# Patient Record
Sex: Male | Born: 1955 | Race: White | Hispanic: No | Marital: Married | Smoking: Former smoker
Health system: Southern US, Community
[De-identification: ages and names within clinical notes are randomized; demographics above are authoritative.]

## PROBLEM LIST (undated history)

## (undated) DIAGNOSIS — I10 Essential (primary) hypertension: Secondary | ICD-10-CM

## (undated) DIAGNOSIS — E119 Type 2 diabetes mellitus without complications: Secondary | ICD-10-CM

## (undated) DIAGNOSIS — M1990 Unspecified osteoarthritis, unspecified site: Secondary | ICD-10-CM

## (undated) DIAGNOSIS — E785 Hyperlipidemia, unspecified: Secondary | ICD-10-CM

## (undated) DIAGNOSIS — N390 Urinary tract infection, site not specified: Secondary | ICD-10-CM

## (undated) DIAGNOSIS — N200 Calculus of kidney: Secondary | ICD-10-CM

## (undated) DIAGNOSIS — E7800 Pure hypercholesterolemia, unspecified: Secondary | ICD-10-CM

## (undated) DIAGNOSIS — N400 Enlarged prostate without lower urinary tract symptoms: Secondary | ICD-10-CM

## (undated) HISTORY — DX: Pure hypercholesterolemia, unspecified: E78.00

## (undated) HISTORY — DX: Urinary tract infection, site not specified: N39.0

## (undated) HISTORY — DX: Type 2 diabetes mellitus without complications: E11.9

## (undated) HISTORY — DX: Essential (primary) hypertension: I10

## (undated) HISTORY — DX: Calculus of kidney: N20.0

## (undated) HISTORY — PX: HERNIA REPAIR: SHX51

## (undated) HISTORY — DX: Benign prostatic hyperplasia without lower urinary tract symptoms: N40.0

## (undated) HISTORY — DX: Unspecified osteoarthritis, unspecified site: M19.90

## (undated) HISTORY — PX: KIDNEY STONE SURGERY: SHX686

## (undated) HISTORY — DX: Hyperlipidemia, unspecified: E78.5

---

## 1998-03-14 DIAGNOSIS — E119 Type 2 diabetes mellitus without complications: Secondary | ICD-10-CM

## 1998-03-14 HISTORY — DX: Type 2 diabetes mellitus without complications: E11.9

## 2002-02-10 ENCOUNTER — Emergency Department: Admit: 2002-02-10 | Payer: Self-pay | Source: Emergency Department | Admitting: Emergency Medicine

## 2005-11-23 ENCOUNTER — Emergency Department: Admit: 2005-11-23 | Payer: Self-pay | Source: Emergency Department | Admitting: Emergency Medicine

## 2005-11-23 LAB — CBC WITH AUTO DIFFERENTIAL CERNER
Basophils Absolute: 0 /mm3 (ref 0.0–0.2)
Basophils: 1 % (ref 0–2)
Eosinophils Absolute: 0.3 /mm3 (ref 0.0–0.7)
Eosinophils: 4 % (ref 0–5)
Granulocytes Absolute: 3.5 /mm3 (ref 1.8–8.1)
Hematocrit: 35.1 % — ABNORMAL LOW (ref 42.0–52.0)
Hgb: 12.3 G/DL — ABNORMAL LOW (ref 13.0–17.0)
Lymphocytes Absolute: 2.4 /mm3 (ref 0.5–4.4)
Lymphocytes: 36 % (ref 15–41)
MCH: 30.9 PG (ref 28.0–32.0)
MCHC: 35.1 G/DL (ref 32.0–36.0)
MCV: 88.1 FL (ref 80.0–100.0)
MPV: 6.8 FL — ABNORMAL LOW (ref 7.4–10.4)
Monocytes Absolute: 0.6 /mm3 (ref 0.0–1.2)
Monocytes: 9 % (ref 0–11)
Neutrophils %: 51 % — ABNORMAL LOW (ref 52–75)
Platelets: 298 /mm3 (ref 140–400)
RBC: 3.98 /mm3 — ABNORMAL LOW (ref 4.70–6.00)
RDW: 12.8 % (ref 11.5–15.0)
WBC: 6.9 /mm3 (ref 3.5–10.8)

## 2005-11-23 LAB — BASIC METABOLIC PANEL
BUN: 24 MG/DL — ABNORMAL HIGH (ref 7–21)
CO2: 24 MEQ/L (ref 22–31)
Calcium: 10.1 MG/DL (ref 8.6–10.2)
Chloride: 105 MEQ/L (ref 98–107)
Creatinine: 1 MG/DL (ref 0.5–1.4)
Glucose: 116 MG/DL — ABNORMAL HIGH (ref 65–110)
Potassium: 4.4 MEQ/L (ref 3.6–5.0)
Sodium: 140 MEQ/L (ref 136–143)

## 2005-11-23 LAB — CKMB MASS CERNER: CKMB Mass: 2.7 ng/mL (ref 0.0–5.0)

## 2005-11-23 LAB — CREATINE KINASE W/O REFLEX (SOFT): Creatine Kinase (CK): 374 U/L — ABNORMAL HIGH (ref 20–297)

## 2005-11-23 LAB — GFR

## 2005-11-23 LAB — TROPONIN I QUANTITATIVE LEVEL - IFOH CERNER: Troponin I Quant: <0.08 NG/ML

## 2006-02-21 ENCOUNTER — Emergency Department: Admit: 2006-02-21 | Payer: Self-pay | Source: Emergency Department | Admitting: Emergency Medical Services

## 2006-02-21 LAB — CBC WITH AUTO DIFFERENTIAL CERNER
Basophils Absolute: 0.1 /mm3 (ref 0.0–0.2)
Basophils: 2 % (ref 0–2)
Eosinophils Absolute: 0.1 /mm3 (ref 0.0–0.7)
Eosinophils: 2 % (ref 0–5)
Granulocytes Absolute: 3.9 /mm3 (ref 1.8–8.1)
Hematocrit: 35.3 % — ABNORMAL LOW (ref 42.0–52.0)
Hgb: 12.4 G/DL — ABNORMAL LOW (ref 13.0–17.0)
Lymphocytes Absolute: 1.6 /mm3 (ref 0.5–4.4)
Lymphocytes: 26 % (ref 15–41)
MCH: 30.9 PG (ref 28.0–32.0)
MCHC: 35 G/DL (ref 32.0–36.0)
MCV: 88.1 FL (ref 80.0–100.0)
MPV: 7 FL — ABNORMAL LOW (ref 7.4–10.4)
Monocytes Absolute: 0.4 /mm3 (ref 0.0–1.2)
Monocytes: 7 % (ref 0–11)
Neutrophils %: 63 % (ref 52–75)
Platelets: 315 /mm3 (ref 140–400)
RBC: 4.01 /mm3 — ABNORMAL LOW (ref 4.70–6.00)
RDW: 12.9 % (ref 11.5–15.0)
WBC: 6.1 /mm3 (ref 3.5–10.8)

## 2006-02-21 LAB — BASIC METABOLIC PANEL
BUN: 26 MG/DL — ABNORMAL HIGH (ref 7–21)
CO2: 23 MEQ/L (ref 22–31)
Calcium: 9.7 MG/DL (ref 8.6–10.2)
Chloride: 106 MEQ/L (ref 98–107)
Creatinine: 1.1 MG/DL (ref 0.5–1.4)
Glucose: 347 MG/DL — ABNORMAL HIGH (ref 65–110)
Potassium: 5.4 MEQ/L — ABNORMAL HIGH (ref 3.6–5.0)
Sodium: 139 MEQ/L (ref 136–143)

## 2006-02-21 LAB — GFR

## 2006-11-09 ENCOUNTER — Emergency Department: Admit: 2006-11-09 | Payer: Self-pay | Source: Emergency Department | Admitting: Emergency Medical Services

## 2006-11-09 LAB — BASIC METABOLIC PANEL
BUN: 22 MG/DL — ABNORMAL HIGH (ref 7–21)
CO2: 23 MEQ/L (ref 22–31)
Calcium: 9.4 MG/DL (ref 8.6–10.2)
Chloride: 103 MEQ/L (ref 98–107)
Creatinine: 1.2 MG/DL (ref 0.5–1.4)
Glucose: 235 MG/DL — ABNORMAL HIGH (ref 65–110)
Potassium: 5.4 MEQ/L — ABNORMAL HIGH (ref 3.6–5.0)
Sodium: 138 MEQ/L (ref 136–143)

## 2006-11-09 LAB — HEPATIC FUNCTION PANEL
ALT: 35 U/L (ref 7–56)
AST (SGOT): 33 U/L (ref 5–40)
Albumin/Globulin Ratio: 1.7 (ref 1.1–1.8)
Albumin: 4.8 G/DL (ref 3.7–5.1)
Alkaline Phosphatase: 71 U/L (ref 43–122)
Bilirubin Direct: 0 MG/DL — AB (ref 0.0–0.3)
Bilirubin Indirect: 0.5 MG/DL (ref 0.0–1.1)
Bilirubin, Total: 0.5 MG/DL (ref 0.2–1.3)
Globulin: 2.9 G/DL (ref 2.0–3.7)
Protein, Total: 7.7 G/DL (ref 6.0–8.0)

## 2006-11-09 LAB — CBC WITH AUTO DIFFERENTIAL CERNER
Hematocrit: 39.5 % — ABNORMAL LOW (ref 42.0–52.0)
Hgb: 14.1 G/DL (ref 13.0–17.0)
MCH: 30.8 PG (ref 28.0–32.0)
MCHC: 35.7 G/DL (ref 32.0–36.0)
MCV: 86.3 FL (ref 80.0–100.0)
MPV: 7.1 FL — ABNORMAL LOW (ref 7.4–10.4)
Platelets: 283 /mm3 (ref 140–400)
RBC: 4.58 /mm3 — ABNORMAL LOW (ref 4.70–6.00)
RDW: 12.9 % (ref 11.5–15.0)
WBC: 13.4 /mm3 — ABNORMAL HIGH (ref 3.5–10.8)

## 2006-11-09 LAB — LIPASE: Lipase: 174 U/L (ref 23–300)

## 2006-11-09 LAB — GFR

## 2006-12-20 ENCOUNTER — Emergency Department: Admit: 2006-12-20 | Payer: Self-pay | Source: Emergency Department

## 2008-12-04 ENCOUNTER — Emergency Department: Admit: 2008-12-04 | Payer: Self-pay | Source: Emergency Department

## 2008-12-04 LAB — BASIC METABOLIC PANEL
BUN: 21 MG/DL (ref 7–21)
CO2: 23 MEQ/L (ref 22–31)
Calcium: 10.1 MG/DL (ref 8.6–10.2)
Chloride: 99 MEQ/L (ref 98–107)
Creatinine: 0.8 MG/DL (ref 0.5–1.4)
Glucose: 122 MG/DL — ABNORMAL HIGH (ref 65–110)
Potassium: 4.3 MEQ/L (ref 3.6–5.0)
Sodium: 135 MEQ/L — ABNORMAL LOW (ref 136–143)

## 2008-12-04 LAB — URINALYSIS
Bilirubin, UA: NEGATIVE
Blood, UA: NEGATIVE
Glucose, UA: NEGATIVE
Ketones UA: NEGATIVE
Leukocyte Esterase, UA: NEGATIVE
Nitrite, UA: NEGATIVE
Protein, UR: NEGATIVE
Specific Gravity UA POCT: 1.004 (ref 1.001–1.035)
Urine pH: 5.5 (ref 5.0–8.0)
Urobilinogen, UA: NORMAL mg/dL

## 2008-12-04 LAB — CBC AND DIFFERENTIAL
Basophils Absolute: 0 /mm3 (ref 0.0–0.2)
Basophils: 0 % (ref 0–2)
Eosinophils Absolute: 0.2 /mm3 (ref 0.0–0.7)
Eosinophils: 2 % (ref 0–5)
Granulocytes Absolute: 5.7 /mm3 (ref 1.8–8.1)
Hematocrit: 35.5 % — ABNORMAL LOW (ref 42.0–52.0)
Hgb: 12.9 G/DL — ABNORMAL LOW (ref 13.0–17.0)
Immature Granulocytes Absolute: 0 CUMM (ref 0.0–0.0)
Immature Granulocytes: 0 % (ref 0–1)
Lymphocytes Absolute: 2.7 /mm3 (ref 0.5–4.4)
Lymphocytes: 29 % (ref 15–41)
MCH: 30.3 PG (ref 28.0–32.0)
MCHC: 36.3 G/DL — ABNORMAL HIGH (ref 32.0–36.0)
MCV: 83.3 FL (ref 80.0–100.0)
MPV: 8.6 FL — ABNORMAL LOW (ref 9.4–12.3)
Monocytes Absolute: 0.8 /mm3 (ref 0.0–1.2)
Monocytes: 9 % (ref 0–11)
Neutrophils %: 60 % (ref 52–75)
Platelets: 237 /mm3 (ref 140–400)
RBC: 4.26 /mm3 — ABNORMAL LOW (ref 4.70–6.00)
RDW: 12.5 % (ref 11.5–15.0)
WBC: 9.44 /mm3 (ref 3.50–10.80)

## 2008-12-04 LAB — CKMB MASS CERNER: CKMB Mass: 2.7 ng/mL (ref 0.0–5.0)

## 2008-12-04 LAB — TROPONIN I QUANTITATIVE LEVEL - IFOH CERNER: Troponin I Quant: <0.01 NG/ML

## 2008-12-04 LAB — GFR

## 2008-12-04 LAB — CK: Creatine Kinase (CK): 260 U/L (ref 20–297)

## 2010-08-27 ENCOUNTER — Emergency Department
Admit: 2010-08-27 | Disposition: A | Discharge status: Home / Self Care | Payer: Self-pay | Source: Emergency Department | Admitting: Emergency Medicine

## 2010-08-31 ENCOUNTER — Ambulatory Visit: Admit: 2010-08-31 | Discharge: 2010-08-31 | Payer: Self-pay | Source: Ambulatory Visit

## 2010-09-10 ENCOUNTER — Ambulatory Visit: Admit: 2010-09-10 | Discharge: 2010-09-10 | Payer: Self-pay | Source: Ambulatory Visit

## 2010-12-08 LAB — ECG 12-LEAD
Atrial Rate: 102 {beats}/min
P Axis: 48 degrees
P-R Interval: 176 ms
Q-T Interval: 340 ms
QRS Duration: 106 ms
QTC Calculation (Bezet): 443 ms
R Axis: -28 degrees
T Axis: 49 degrees
Ventricular Rate: 102 {beats}/min

## 2010-12-28 LAB — ECG 12-LEAD
Atrial Rate: 82 {beats}/min
P Axis: 42 degrees
P-R Interval: 180 ms
Q-T Interval: 366 ms
QRS Duration: 104 ms
QTC Calculation (Bezet): 427 ms
R Axis: -17 degrees
T Axis: 30 degrees
Ventricular Rate: 82 {beats}/min

## 2010-12-30 LAB — ECG 12-LEAD
Atrial Rate: 102 {beats}/min
P Axis: 33 degrees
P-R Interval: 164 ms
Q-T Interval: 326 ms
QRS Duration: 94 ms
QTC Calculation (Bezet): 424 ms
R Axis: -12 degrees
T Axis: 1 degrees
Ventricular Rate: 102 {beats}/min

## 2014-04-30 ENCOUNTER — Observation Stay: Payer: BLUE CROSS/BLUE SHIELD | Admitting: Internal Medicine

## 2014-04-30 ENCOUNTER — Observation Stay
Admission: EM | Admit: 2014-04-30 | Discharge: 2014-05-01 | Disposition: A | Discharge status: Home / Self Care | Payer: BLUE CROSS/BLUE SHIELD | Attending: Internal Medicine | Admitting: Internal Medicine

## 2014-04-30 ENCOUNTER — Emergency Department: Payer: BLUE CROSS/BLUE SHIELD

## 2014-04-30 DIAGNOSIS — R0789 Other chest pain: Principal | ICD-10-CM | POA: Insufficient documentation

## 2014-04-30 DIAGNOSIS — E119 Type 2 diabetes mellitus without complications: Secondary | ICD-10-CM | POA: Insufficient documentation

## 2014-04-30 DIAGNOSIS — R079 Chest pain, unspecified: Secondary | ICD-10-CM | POA: Diagnosis present

## 2014-04-30 DIAGNOSIS — F17290 Nicotine dependence, other tobacco product, uncomplicated: Secondary | ICD-10-CM | POA: Insufficient documentation

## 2014-04-30 DIAGNOSIS — I10 Essential (primary) hypertension: Secondary | ICD-10-CM | POA: Insufficient documentation

## 2014-04-30 DIAGNOSIS — E785 Hyperlipidemia, unspecified: Secondary | ICD-10-CM | POA: Insufficient documentation

## 2014-04-30 LAB — CBC AND DIFFERENTIAL
Basophils Absolute Automated: 0.02 10*3/uL (ref 0.00–0.20)
Basophils Automated: 0 %
Eosinophils Absolute Automated: 0.15 10*3/uL (ref 0.00–0.70)
Eosinophils Automated: 2 %
Hematocrit: 37.7 % — ABNORMAL LOW (ref 42.0–52.0)
Hgb: 13.6 g/dL (ref 13.0–17.0)
Immature Granulocytes Absolute: 0.01 10*3/uL
Immature Granulocytes: 0 %
Lymphocytes Absolute Automated: 1.8 10*3/uL (ref 0.50–4.40)
Lymphocytes Automated: 28 %
MCH: 31.1 pg (ref 28.0–32.0)
MCHC: 36.1 g/dL — ABNORMAL HIGH (ref 32.0–36.0)
MCV: 86.1 fL (ref 80.0–100.0)
MPV: 8.8 fL — ABNORMAL LOW (ref 9.4–12.3)
Monocytes Absolute Automated: 0.49 10*3/uL (ref 0.00–1.20)
Monocytes: 8 %
Neutrophils Absolute: 3.94 10*3/uL (ref 1.80–8.10)
Neutrophils: 62 %
Nucleated RBC: 0 /100 WBC (ref 0–1)
Platelets: 207 10*3/uL (ref 140–400)
RBC: 4.38 10*6/uL — ABNORMAL LOW (ref 4.70–6.00)
RDW: 13 % (ref 12–15)
WBC: 6.4 10*3/uL (ref 3.50–10.80)

## 2014-04-30 LAB — TROPONIN I
Troponin I: <0.01 ng/mL (ref 0.00–0.09)
Troponin I: <0.01 ng/mL (ref 0.00–0.09)

## 2014-04-30 LAB — BASIC METABOLIC PANEL
Anion Gap: 9 (ref 5.0–15.0)
BUN: 15 mg/dL (ref 9–28)
CO2: 23 mEq/L (ref 22–29)
Calcium: 9.4 mg/dL (ref 8.5–10.5)
Chloride: 106 mEq/L (ref 100–111)
Creatinine: 0.8 mg/dL (ref 0.7–1.3)
Glucose: 165 mg/dL — ABNORMAL HIGH (ref 70–100)
Potassium: 4.4 mEq/L (ref 3.5–5.1)
Sodium: 138 mEq/L (ref 136–145)

## 2014-04-30 LAB — GLUCOSE WHOLE BLOOD - POCT
Whole Blood Glucose POCT: 142 mg/dL — ABNORMAL HIGH (ref 70–100)
Whole Blood Glucose POCT: 146 mg/dL — ABNORMAL HIGH (ref 70–100)
Whole Blood Glucose POCT: 169 mg/dL — ABNORMAL HIGH (ref 70–100)

## 2014-04-30 LAB — GFR: EGFR: >60

## 2014-04-30 MED ORDER — ATORVASTATIN CALCIUM 20 MG PO TABS
80.0000 mg | ORAL_TABLET | Freq: Every day | ORAL | Status: DC
Start: 2014-04-30 — End: 2014-05-01
  Administered 2014-04-30 – 2014-05-01 (×2): 80 mg via ORAL
  Filled 2014-04-30 (×2): qty 4

## 2014-04-30 MED ORDER — LISINOPRIL 10 MG PO TABS
10.0000 mg | ORAL_TABLET | Freq: Every day | ORAL | Status: DC
Start: 2014-05-01 — End: 2014-05-01

## 2014-04-30 MED ORDER — ENOXAPARIN SODIUM 40 MG/0.4ML SC SOLN
40.0000 mg | Freq: Every day | SUBCUTANEOUS | Status: DC
Start: 2014-04-30 — End: 2014-05-01
  Administered 2014-04-30 – 2014-05-01 (×2): 40 mg via SUBCUTANEOUS
  Filled 2014-04-30 (×2): qty 0.4

## 2014-04-30 MED ORDER — ASPIRIN 81 MG PO CHEW
81.0000 mg | CHEWABLE_TABLET | Freq: Every morning | ORAL | Status: DC
Start: 2014-04-30 — End: 2014-05-01
  Administered 2014-05-01: 81 mg via ORAL
  Filled 2014-04-30: qty 1

## 2014-04-30 MED ORDER — LISINOPRIL 20 MG PO TABS
20.0000 mg | ORAL_TABLET | Freq: Every day | ORAL | Status: DC
Start: 2014-04-30 — End: 2014-04-30

## 2014-04-30 MED ORDER — GLUCAGON 1 MG IJ SOLR (WRAP)
1.0000 mg | INTRAMUSCULAR | Status: DC | PRN
Start: 2014-04-30 — End: 2014-05-01

## 2014-04-30 MED ORDER — DEXTROSE 50 % IV SOLN
25.0000 mL | INTRAVENOUS | Status: DC | PRN
Start: 2014-04-30 — End: 2014-05-01

## 2014-04-30 NOTE — ED Notes (Addendum)
Pt is a 38 yho male, alert and oriented, who is presenting to ED by EMS with complaint of 3 episodes L sided CP radiating to L arm since 04/25/2014. Pt denies n/v/diaphoresis with episodes. Reports that it feels like an electric charge lasting approx 2 seconds then feels like he needs to sit down because feels generally ill. Pt describes pain as a jolt followed by a thud. Pt uses treadmill daily and has had no complaints of pain while using it. 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 Pt received 1 spray nitro by EMS and pain resolved. Pt also received 324 mg ASA. Pt currently in NAD.

## 2014-04-30 NOTE — ED Notes (Signed)
Bed: A05  Expected date:   Expected time:   Means of arrival:   Comments:  Medic 302

## 2014-04-30 NOTE — Plan of Care (Signed)
Problem: Health Promotion  Goal: Knowledge - disease process  Extent of understanding conveyed about a specific disease process.   Outcome: Progressing  Provide pain medication and comfort for patient PRN no noted resp or cardiac distress.         Problem: Safety  Goal: Patient will be free from injury during hospitalization  Outcome: Progressing  Maintained safety, call light within reach.

## 2014-04-30 NOTE — H&P (Signed)
ADMISSION HISTORY AND PHYSICAL EXAM    Date Time: 04/30/2014 2:14 PM  Patient Name: Andrew [name]  Attending Physician: Frederich Cha, MD    History of Present Illness:   Andrew [name] is a 59 y.o. male who presents to the hospital with chest pain. He reports had sudden onset of "sharp" L arm pain, lasting for about a few seconds, which occurred while he was pushing hard on an object at work. Pt said pain felt like an "electrical shock." going from shoulder to hand. Pt has had 2 other similar episodes during past 5 days. He was able to run on the treadmill about 45 minutes everyday. He reports had an exercise stress test a few years ago negative. Denies SOB, N/V, diaphoresis or neck pain lightheadedness.     Past Medical History:     Past Medical History   Diagnosis Date   . Hypertension    . Diabetes mellitus    . High cholesterol        Past Surgical History:     Past Surgical History   Procedure Laterality Date   . Hernia repair       umbilical       Medications:     Prior to Admission medications    Medication Sig Start Date End Date Taking? Authorizing Provider   atorvastatin (LIPITOR) 80 MG tablet Take 80 mg by mouth daily.   Yes [provider]   Exenatide ER 2 MG Pen-injector Inject into the skin once a week.      Yes [provider]   lisinopril (PRINIVIL,ZESTRIL) 20 MG tablet Take 20 mg by mouth daily.   Yes [provider]   metFORMIN (GLUCOPHAGE) 500 MG tablet Take 500 mg by mouth 2 (two) times daily with meals.   Yes [provider]   Multiple Vitamin (MULTIVITAMIN) tablet Take 1 tablet by mouth daily.   Yes [provider]        Allergies:   No Known Allergies    Social History:     History     Social History   . Marital Status: Married     Spouse Name: N/A     Number of Children: N/A   . Years of Education: N/A     Social History Main Topics   . Smoking status: Current Some Day Smoker     Types: Pipe   . Smokeless tobacco: Not on file   . Alcohol Use:  Yes      Comment: 1 drink per month   . Drug Use: No   . Sexual Activity: Not on file     Other Topics Concern   . Not on file     Social History Narrative   . No narrative on file       Family History:   No family history on file.    Review of Systems:   Comprehensive review of systems was performed and 12 systems were reviewed.  History obtained from the patient      Physical Exam:     Filed Vitals:    04/30/14 1228   BP: 132/69   Pulse: 83   Temp: 96.8 F (36 C)   Resp: 20   SpO2: 99%       No intake or output data in the 24 hours ending 04/30/14 1414      VITALS:  Filed Vitals:    04/30/14 1020 04/30/14 1142 04/30/14 1228   BP: 147/78 147/78 132/69  Pulse: 87 76 83   Temp:  97.9 F (36.6 C) 96.8 F (36 C)   TempSrc:  Oral    Resp: 18 18 20    Height: 1.803 m (5\' 11" )  1.803 m (5\' 11" )   Weight: 87.544 kg (193 lb)  87.544 kg (193 lb)   SpO2: 99% 99% 99%       PHYSICAL EXAM:  GEN APPEARANCE: No acute distress  HEENT: PERLA; EOMI; Conjunctiva Clear  NECK: Supple; FROM  CVS: RRR, S1, S2; No M/G/R  LUNGS: CTAB; no wheezes, rales or rhonchi, no reproducible chest wall pain  ABD: Soft; NT/ND; Normoactive BS  EXT: No edema; Pulses 2+ and intact.   NEURO: Awake alert . No gross focal neurological deficits      Labs and Diagnostic Data:     Recent Labs  Lab 04/30/14  1100   WBC 6.40   HEMOGLOBIN 13.6   HEMATOCRIT 37.7*   MCV 86.1   PLATELETS 207         Recent Labs  Lab 04/30/14  1100   SODIUM 138   POTASSIUM 4.4   CHLORIDE 106   CO2 23   BUN 15   CREATININE 0.8   GLUCOSE 165*   CALCIUM 9.4             Invalid input(s): ALP, BIL        Radiological Studies:   Xr Chest  Ap Portable    04/30/2014   CLINICAL HISTORY:   Chest pain   COMPARISON: Chest x-ray on 12/04/2008  FINDINGS: Heart size is normal.  The lungs are clear.  There are no masses, nodules, areas of consolidation, pleural effusions, pneumothoraces, or other abnormalities.  The mediastinum, hila, osseous structures, and overlying soft tissues are normal.         04/30/2014     Normal single view chest xray. No change.  Trilby Drummer, MD  04/30/2014 10:50 AM         Assessment and Plan:   Andrew [name] is a 59 y.o. male with     Chest pain left arm pain likely atypical   Hx of niddm  Hx of htn  Hx of hlp      Plan:  Cont trend cardiac enzyme  D/wed RN try to locate ekg that was done in ER. Per report negative for any acute stt changes  Cards eval for stress test inpt vs o/p  Cont asa 81mg  daily  Home acei with parameter  Cont home statin check lipid  Ssi, resume home meformin as needed  dvt ppx    Signed by: Frederich Cha, MD

## 2014-04-30 NOTE — ED Provider Notes (Signed)
Physician/Midlevel provider first contact with patient: 04/30/14 1008         [facility] EMERGENCY DEPARTMENT HISTORY AND PHYSICAL EXAM    Patient Name: Andrew [name], Andrew [name]  Encounter Date:  04/30/2014  Rendering Provider: Bonner Puna, MD  Patient DOB:  December 07, 1955  MRN:  [id number]    History of Presenting Illness     Historian: Patient    59 y.o. male smoker with h/o HTN, DM, and high cholesterol presents via EMS w/ sudden onset of "sharp" L arm pain, lasting for about 1 second, that occurred while he was pushing hard on an object at work approx 1.75 hours ago.  Pt said pain felt like an "electrical shock."  After the sharp pain, he felt fatigued and mild L arm discomfort which resolved s/p nitro en route.  Pt has had 2 other similar episodes since 5 days ago.  The first occurred after work and radiated to his upper chest.  Pt notes he runs on the treadmill about 45 minutes everyday.  No previous similar episode.  Denies SOB, N/V, neck pain, and lightheadedness.    PMD:  Bertram Gala, MD    Past Medical History     Past Medical History   Diagnosis Date   . Hypertension    . Diabetes mellitus    . High cholesterol        Past Surgical History     Past Surgical History   Procedure Laterality Date   . Hernia repair       umbilical       Family History     No family history on file.    Social History     History     Social History   . Marital Status: Married     Spouse Name: N/A     Number of Children: N/A   . Years of Education: N/A     Social History Main Topics   . Smoking status: Current Some Day Smoker     Types: Pipe   . Smokeless tobacco: Not on file   . Alcohol Use: Yes      Comment: 1 drink per month   . Drug Use: No   . Sexual Activity: Not on file     Other Topics Concern   . Not on file     Social History Narrative   . No narrative on file       Home Medications     Home medications reviewed by ED MD     Current Discharge Medication List      CONTINUE these medications which have NOT CHANGED     Details   atorvastatin (LIPITOR) 80 MG tablet Take 80 mg by mouth daily.      Exenatide ER 2 MG Pen-injector Inject into the skin once a week.         lisinopril (PRINIVIL,ZESTRIL) 20 MG tablet Take 20 mg by mouth daily.      metFORMIN (GLUCOPHAGE) 500 MG tablet Take 500 mg by mouth 2 (two) times daily with meals.      Multiple Vitamin (MULTIVITAMIN) tablet Take 1 tablet by mouth daily.             Review of Systems     Constitutional:  +Fatigue, No fever  Eyes: No discharge   ENT: No ST  CV:  +CP (resolved)  Resp:  No SOB or cough  GI: No abd pain, N, V, D  GU: No dysuria  MS:  +L arm pain (resolved), No neck pain  Skin: No rash  Neuro:  No lightheadedness, No HA  Psych:  No behavior changes  All other systems reviewed and negative    Physical Exam     BP 132/69 mmHg  Pulse 83  Temp(Src) 96.8 F (36 C) (Oral)  Resp 20  Ht 5\' 11"  (1.803 m)  Wt 87.544 kg  BMI 26.93 kg/m2  SpO2 99%    CONSTITUTIONAL: Vital signs reviewed, Well appearing, Alert and oriented X 3.   HEAD: Atraumatic, Normocephalic.   EYES: Eyes are normal to inspection, Pupils equal, round and reactive to light, Sclera are normal, Conjunctiva are normal.   ENT: Nose examination normal, Posterior pharynx normal, Mouth normal to inspection.   NECK: No discomfort with ROM of cervical spine, Normal ROM.   RESPIRATORY CHEST: Chest is nontender, Breath sounds normal, No respiratory distress.   CARDIOVASCULAR: RRR, No murmurs.   ABDOMEN: Abdomen is nontender, Bowel sounds normal, No distension, No peritoneal signs.   BACK: There is no CVA Tenderness, There is no tenderness to palpation.   UPPER EXTREMITY:  No discomfort with ROM of left arm.  NEURO: No focal motor deficits, No focal sensory deficits, Speech normal.   SKIN: Skin is warm, Skin is dry, Skin is normal color.   PSYCHIATRIC: Oriented X 3, Normal affect, Normal insight.     ED Medications Administered     ED Medication Orders     None          Orders Placed During This Encounter     Orders  Placed This Encounter   Procedures   . XR Chest  AP Portable   . CBC with differential   . Basic Metabolic Panel   . Troponin I   . GFR   . Cardiac monitoring (ED Only)   . May be transported off monitor   . ECG 12 lead   . Saline lock IV   . Einar Gip ED Bed Request       Diagnostic Study Results     The results of the diagnostic studies below were reviewed by the ED provider:    Labs  Results     Procedure Component Value Units Date/Time    Troponin I [[id number]] Collected:  04/30/14 1101    Specimen Information:  Blood Updated:  04/30/14 1129     Troponin I <0.01 ng/mL     Basic Metabolic Panel [[id number]]  (Abnormal) Collected:  04/30/14 1100    Specimen Information:  Blood Updated:  04/30/14 1126     Glucose 165 (H) mg/dL      BUN 15 mg/dL      Creatinine 0.8 mg/dL      CALCIUM 9.4 mg/dL      Sodium 147 mEq/L      Potassium 4.4 mEq/L      Chloride 106 mEq/L      CO2 23 mEq/L      Anion Gap 9.0     GFR [[id number]] Collected:  04/30/14 1100     EGFR >60.0 Updated:  04/30/14 1126    CBC with differential [[id number]]  (Abnormal) Collected:  04/30/14 1100    Specimen Information:  Blood / Blood Updated:  04/30/14 1107     WBC 6.40 x10 3/uL      Hgb 13.6 g/dL      Hematocrit 96.2 (L) %      Platelets 207 x10 3/uL      RBC 4.38 (L) x10 6/uL  MCV 86.1 fL      MCH 31.1 pg      MCHC 36.1 (H) g/dL      RDW 13 %      MPV 8.8 (L) fL      Neutrophils 62 %      Lymphocytes Automated 28 %      Monocytes 8 %      Eosinophils Automated 2 %      Basophils Automated 0 %      Immature Granulocyte 0 %      Nucleated RBC 0 /100 WBC      Neutrophils Absolute 3.94 x10 3/uL      Abs Lymph Automated 1.80 x10 3/uL      Abs Mono Automated 0.49 x10 3/uL      Abs Eos Automated 0.15 x10 3/uL      Absolute Baso Automated 0.02 x10 3/uL      Absolute Immature Granulocyte 0.01 x10 3/uL           Radiologic Studies  Radiology Results (24 Hour)     Procedure Component Value Units Date/Time    XR Chest  AP Portable [[id number]] Collected:  04/30/14  1049    Order Status:  Completed Updated:  04/30/14 1054    Narrative:      CLINICAL HISTORY:   Chest pain     COMPARISON: Chest x-ray on 12/04/2008    FINDINGS: Heart size is normal.  The lungs are clear.  There are no  masses, nodules, areas of consolidation, pleural effusions,  pneumothoraces, or other abnormalities.  The mediastinum, hila, osseous  structures, and overlying soft tissues are normal.            Impression:        Normal single view chest xray. No change.    Trilby Drummer, MD   04/30/2014 10:50 AM            Scribe and MD Attestations     I, Bonner Puna, MD, personally performed the services documented. Carlyn Jimmey Ralph Poindexter is scribing for me on Sporn,Kodee BRUCE. I reviewed and confirm the accuracy of the information in this medical record.    I, Carlyn [employer], am serving as a Neurosurgeon to document services personally performed by Bonner Puna, MD, based on the provider's statements to me.     Rendering Provider: Bonner Puna, MD    Monitors, EKG, Critical Care, and Splints     EKG (interpreted by ED physician): NSR, Rate at 77 bpm, Nonspecific ST changes, Abnormal EKG, No significant change from 11/2008  Cardiac Monitor (interpreted by ED physician): NSR, Rate at 80 bpm, Normal intervals, No ectopy    Critical Care:   Splint check:      MDM and Clinical Notes     Notes/Consults:    Pt given ASA en route.    @1143 : D/w Dr. Sherrilee Gilles (hospitalist) who accepts for admission.      @1150 :  Pt remained pain free.  He will be admitted for further cardiac evaluation as he has multiple cardiac risk factors w/ concerning symptoms.    Diagnosis and Disposition     Clinical Impression  1. Chest pain, unspecified chest pain type        Disposition  ED Disposition     Admit Bed Type: Telemetry [5]  Bed request comments: Dx: CP  Admitting Physician: Frederich Cha [[id number]]  Patient Class: Observation [104]  Prescriptions       Current Discharge Medication List                     Lorenza Burton Self, MD  04/30/14 [phone]

## 2014-05-01 LAB — CBC AND DIFFERENTIAL
Basophils Absolute Automated: 0.01 10*3/uL (ref 0.00–0.20)
Basophils Automated: 0 %
Eosinophils Absolute Automated: 0.2 10*3/uL (ref 0.00–0.70)
Eosinophils Automated: 3 %
Hematocrit: 35.5 % — ABNORMAL LOW (ref 42.0–52.0)
Hgb: 12.5 g/dL — ABNORMAL LOW (ref 13.0–17.0)
Immature Granulocytes Absolute: 0 10*3/uL
Immature Granulocytes: 0 %
Lymphocytes Absolute Automated: 1.87 10*3/uL (ref 0.50–4.40)
Lymphocytes Automated: 29 %
MCH: 30.3 pg (ref 28.0–32.0)
MCHC: 35.2 g/dL (ref 32.0–36.0)
MCV: 86.2 fL (ref 80.0–100.0)
MPV: 9 fL — ABNORMAL LOW (ref 9.4–12.3)
Monocytes Absolute Automated: 0.52 10*3/uL (ref 0.00–1.20)
Monocytes: 8 %
Neutrophils Absolute: 3.87 10*3/uL (ref 1.80–8.10)
Neutrophils: 60 %
Nucleated RBC: 0 /100 WBC (ref 0–1)
Platelets: 189 10*3/uL (ref 140–400)
RBC: 4.12 10*6/uL — ABNORMAL LOW (ref 4.70–6.00)
RDW: 13 % (ref 12–15)
WBC: 6.47 10*3/uL (ref 3.50–10.80)

## 2014-05-01 LAB — BASIC METABOLIC PANEL
Anion Gap: 5 (ref 5.0–15.0)
BUN: 12 mg/dL (ref 9–28)
CO2: 24 mEq/L (ref 22–29)
Calcium: 9.1 mg/dL (ref 8.5–10.5)
Chloride: 111 mEq/L (ref 100–111)
Creatinine: 0.7 mg/dL (ref 0.7–1.3)
Glucose: 167 mg/dL — ABNORMAL HIGH (ref 70–100)
Potassium: 4.6 mEq/L (ref 3.5–5.1)
Sodium: 140 mEq/L (ref 136–145)

## 2014-05-01 LAB — HEMOGLOBIN A1C: Hemoglobin A1C: 7.7 % — ABNORMAL HIGH (ref 0.0–6.0)

## 2014-05-01 LAB — LIPID PANEL
Cholesterol / HDL Ratio: 3.3
Cholesterol: 96 mg/dL (ref 0–199)
HDL: 29 mg/dL — ABNORMAL LOW (ref >40)
LDL Calculated: 41 mg/dL (ref 0–99)
Triglycerides: 130 mg/dL (ref 34–149)
VLDL Calculated: 26 mg/dL (ref 10–40)

## 2014-05-01 LAB — ECG 12-LEAD
Atrial Rate: 77 {beats}/min
P Axis: 36 degrees
P-R Interval: 196 ms
Q-T Interval: 356 ms
QRS Duration: 96 ms
QTC Calculation (Bezet): 402 ms
R Axis: -23 degrees
T Axis: 45 degrees
Ventricular Rate: 77 {beats}/min

## 2014-05-01 LAB — GFR: EGFR: >60

## 2014-05-01 LAB — HEMOLYSIS INDEX: Hemolysis Index: 11 (ref 0–18)

## 2014-05-01 LAB — TROPONIN I: Troponin I: <0.01 ng/mL (ref 0.00–0.09)

## 2014-05-01 MED ORDER — LISINOPRIL 5 MG PO TABS
5.0000 mg | ORAL_TABLET | Freq: Every day | ORAL | Status: DC
Start: 2014-05-01 — End: 2014-05-01
  Administered 2014-05-01: 5 mg via ORAL
  Filled 2014-05-01: qty 1

## 2014-05-01 MED ORDER — ASPIRIN 81 MG PO CHEW
81.0000 mg | CHEWABLE_TABLET | Freq: Every morning | ORAL | Status: DC
Start: 2014-05-01 — End: 2014-11-24

## 2014-05-01 NOTE — Progress Notes (Addendum)
Patient is discharged to home in stable condition. Vital stable. Denies any chest discomfort, SR on monitor. Patient verbalized understanding of discharge instructions, medication list and will follow up with physicians. All personal belongings with patient at the time of discharge IV d/c'd with intact catheter and tele removed. Patient refused wheelchair services. Family/wife   to drive her home.

## 2014-05-01 NOTE — Discharge Summary (Signed)
[name]      Patient: Andrew [name]  Admission Date: 04/30/2014   DOB: Aug 26, 1955  Discharge Date: 05/01/2014    MRN: [id number]  Discharge Attending: Frederich Cha, MD     Referring Physician: Bertram Gala, MD  PCP: Bertram Gala, MD       DISCHARGE SUMMARY     Discharge Information   Admission Diagnosis:   Chest pain     Discharge Diagnosis:   Patient Active Problem List    Diagnosis Date Noted   . Chest pain 04/30/2014        Admission Condition: fair  Discharge Condition: good  Functional Status: Patient is independent with mobility/ambulation, transfers, ADL's, IADL's.    Discharge Medications:     Medication List      START taking these medications          aspirin 81 MG chewable tablet   Chew 1 tablet (81 mg total) by mouth every morning.         CONTINUE taking these medications          atorvastatin 80 MG tablet   Commonly known as:  LIPITOR       Exenatide ER 2 MG Pen       lisinopril 20 MG tablet   Commonly known as:  PRINIVIL,ZESTRIL       metFORMIN 500 MG tablet   Commonly known as:  GLUCOPHAGE       multivitamin tablet         Where to Get Your Medications     You need to pick up these prescriptions. We sent them to a specific pharmacy, so go there to get them.           [id number] - [place], [place] - [address]   -  aspirin 81 MG chewable tablet    [address]   [place] [address]   Phone:  [phone]   Hours:  24-hours                          Hospital Course   Presentation History   Andrew [name] is a 59 y.o. male with PMH of HTN. DM, HLD presented to the hospital with chest pain    See HPI for details.    Hospital Course (1 Days)     Chest pain, ACS ruled out with 3 sets of cardiac enzymes negative and EKG with no acute ST changes. Evaluated by cardiology who deemed patient stable for d/c and recommended outpatient stress test. This will be arranged by cardiology.  Lipids and A1C to deferred to  PCP  Continue on home statin and PTA hypoglycemic agents    DM - A1c - 7.7 : BSGs stable. Further Mgt defered to PCP. C/w PTA meds    HTN: C/w Lisinopril 20mg  daily. Patient took it the morning of presentation and pressures have been stable, other than low normal through the night, with no symptoms. Recommend that patient continue with his usual dose and recommend to check BP at home and f/u with PCP for up or down titration    Procedures/Imaging:   XR Chest  AP Portable   Final Result     Normal single view chest xray. No change.      Trilby Drummer, MD    04/30/2014 10:50 AM  Treatment Team:   Attending Provider: Frederich Cha, MD       Best Practices   Was the patient admitted with either a CHF Exacerbation or Pneumonia? No     Progress Note/Physical Exam at Discharge     Subjective: Symptoms have resolved  Patient denies any chest pain, SOB, palpitations, nausea, vomiting, abdominal pain. Denies any bleeding issues.  Denies any discomfort. No complains to offer  Slept well     RN MDR done     Filed Vitals:    04/30/14 1549 04/30/14 1947 04/30/14 2344 05/01/14 0430   BP: 118/59 92/51 92/50  103/56   Pulse: 79 81 75 80   Temp: 97.2 F (36.2 C) 96.2 F (35.7 C) 97 F (36.1 C) 97.2 F (36.2 C)   TempSrc:  Axillary Oral Oral   Resp: 18 17 18 17    Height:       Weight:       SpO2: 99% 97% 97% 97%     General: NAD, AAOx3  HEENT: perrla, eomi, sclera anicteric, OP: Clear, MMM  Neck: supple, FROM, no LAD  Cardiovascular: RRR, no m/r/g  Lungs: CTAB, no w/r/r  Abdomen: soft, +BS, NT/ND, no masses, no g/r  Extremities: no C/C/E. No TTP. No shoulder pain   Back - no tenderness  Skin: no rashes or lesions noted  Neuro: CN 2-12 intact; No Focal neurological deficits       Diagnostics     Labs/Studies Pending at Discharge: No    Last Labs     Recent Labs  Lab 05/01/14  0559 04/30/14  1100   WBC 6.47 6.40   RBC 4.12* 4.38*   HEMOGLOBIN 12.5* 13.6   HEMATOCRIT 35.5* 37.7*   MCV 86.2 86.1   PLATELETS 189 207         Recent  Labs  Lab 05/01/14  0559 04/30/14  1100   SODIUM 140 138   POTASSIUM 4.6 4.4   CHLORIDE 111 106   CO2 24 23   BUN 12 15   CREATININE 0.7 0.8   GLUCOSE 167* 165*   CALCIUM 9.1 9.4             Microbiology Results     None           Patient Instructions   Discharge Diet: cardiac diet  Discharge Activity:  activity as tolerated    Follow Up Appointment:  Follow-up Information     Follow up with Bertram Gala, MD. Schedule an appointment as soon as possible for a visit in 1 week.    Specialty:  Rheumatology    Contact information:    [address]  [address] [place] [address]  [phone]          Follow up with Vira Blanco, MD. Schedule an appointment as soon as possible for a visit in 1 week.    Specialty:  Cardiology    Contact information:    [address]  [address] [address]  [phone]             Time spent examining patient, discussing with patient/family regarding hospital course, chart review, reconciling medications and discharge planning: 45 minutes.    Signed,  Terez Montee DNP, CRNP  8:17 AM 05/01/2014

## 2014-05-01 NOTE — Progress Notes (Signed)
Case Manager unable to do initial assessment. Patient already discharge to home.

## 2014-05-01 NOTE — Plan of Care (Signed)
Problem: Safety  Goal: Patient will be free from injury during hospitalization  Outcome: Progressing  Maintained safety, call light within reach.      Problem: Pain  Goal: Patient's pain/discomfort is manageable  Outcome: Progressing     Pt A/Ox4, denies pain, SR on tele, VSS, NPO-MN .

## 2014-05-01 NOTE — Discharge Instructions (Signed)
Please check your blood pressure at home  Twice a day till you see your primary care. Keep a log   Call your primary care if Blood pressure is greater than 180 (the upper number) or greater than 100 (the lower number)  If less than 90(upper number) hold the dose of Lisinopril    ----------------------  Your A1c is 7.7. Please discuss this with your PCP.   Please check your blood sugars in the morning and evening and before meals.  Continue to keep a log of your blood sugars and the food you eat and follow up with your primary care.  Continue with your oral diabetic meds for now    -------------------------            Noncardiac Chest Pain    Based on your visit today, the health care provider doesn't know what is causing your chest pain. In most cases, people who come to the emergency department with chest pain don't have a problem with their heart. Instead, the pain is caused by other conditions. These may be problems with the lungs, muscles, bones, digestive tract, nerves, or mental health.  Lung problems   Inflammation around the lungs (pleurisy)   Collapsed lung (pneumothorax)   Fluid around the lungs (pleural effusion)   Lung cancer. This is a rare cause of chest pain.  Muscle or bone problems   Inflamed cartilage between the ribs (pleurisy)   Fibromyalgia   Rheumatoid arthritis  Digestive system problems   Reflux   Stomach ulcer   Spasms of the esophagus   Gall stones   Gallbladder inflammation  Mental health conditions   Panic or anxiety attacks   Emotional distress  Your condition doesn't seem serious and your pain doesn't appear to be coming from your heart. But sometimes the signs of a serious problem take more time to appear. Watch for the warning signs listed below.  Home care  Follow these guidelines when caring for yourself at home:   Rest today and avoid strenuous activity.   Take any prescribed medicine as directed.  Follow-up care  Follow up with your health care provider, or as  advised, if you don't start to feel better within 24 hours.  When to seek medical advice  Call your health care provider right away if any of these occur:   A change in the type of pain. Call if it feels different, becomes more serious, lasts longer, or begins to spread into your shoulder, arm, neck, jaw, or back.   Shortness of breath   You feel more pain when you breathe   Cough with dark-colored mucus or blood   Weakness, dizziness, or fainting   Fever of 100.60F (38C) or higher, or as directed by your health care provider   Swelling, pain, or redness in one leg     2000-2015 [employer] [address] [place] [address]. All rights reserved. This information is not intended as a substitute for professional medical care. Always follow your healthcare professional's instructions.          Chest Wall Pain: Costochondritis    The chest pain that you have had today is caused by costochondritis. This condition is caused by an inflammation of the cartilage joining your ribs to your breastbone. It is not caused by heart or lung problems.The inflammation may have been brought on by a blow to the chest, lifting heavy objects, intense exercise, or an illness that made you cough and sneeze.  Itoften occursduring times of emotional stress.It can be painful, but it is not dangerous. It usually goes away in 1 to 2 weeks. But it may happen again. Rarely, a more serious condition may cause symptoms similar to costochondritis. That's why it's important to watch for the warning signs listed below.  Home care  Follow these guidelines when caring for yourself at home:   If you feel that emotional stress is a cause of your condition, try to figure out the sources of that stress. It may not be obvious! Learn ways to deal with the stress in your life. This can include regular exercise, muscle relaxation, meditation, or simply taking time out for yourself. For more information about this, talk with  your health care provider. Or go to your [employer] and look at books on "stress reduction."   You may use acetaminophen or ibuprofen to control pain, unless another pain medicine was prescribed. If you have liver disease or ever had a stomach ulcer, talk with your health care provider before using these medicines.   You can also help ease pain by using a hot, wet compress or heating pad. Use this with or without a medicated skin cream that helps relieves pain.   Do stretching exercise as advised by your provider.   Take any prescribed medicines as directed.  Follow-up care  Follow up with your health care provider, or as advised, if you do not start to get better in the next 2 days.  When to seek medical advice  Call your health care provider right away if any of these occur:   A change in the type of pain. Call if it feels different, becomes more serious, lasts longer, or spreads into your shoulder, arm, neck, jaw, or back.   Shortness of breath or pain gets worse when you breathe   Weakness, dizziness, or fainting   Cough with dark-colored sputum (phlegm) or blood   Abdominal pain   Dark red or black stools   Fever of 100.71F (38C) or higher, or as directed by your health care provider   2000-2015 [employer] [address], [place] [address]. All rights reserved. This information is not intended as a substitute for professional medical care. Always follow your healthcare professional's instructions.          Understanding the Pain Response  Your pain is important. It can slow healing and keep you from being active. You may have acute or chronic pain. Both types of pain respond to treatment. Work with your health care professional. Together you can find relief.  Types of pain  Acute pain is caused by a health problem or injury. The pain usually goes away when its cause is treated. You may have pain:   From an illness or injury that needs emergency care.   After an  operation, such as heart surgery.   During and after the birth of your baby.  Chronic pain lasts 3 to 67month or more. It can be caused by a health problem or injury, such as arthritis or a shoulder strain. Chronic pain can also exist without a clear cause.  Your perception of pain  Pain is a complex phenomenon that involves many of the chemicals found naturally in the spinal cord and brain. All pain signals travel to the brain. The brain sends back signals to protect the body. The brain also makesits own painkillers (endorphins). These can help reduce the pain.    Pain starts in  one or more parts of the body. In some cases, the site of the pain is far from its source.   Pain signals move through nerves and up the spinal cord.   The brain reads the signals as pain. Natural painkillers are released.   The feeling of pain can bereduced in this way.     [address] [employer] [place], [place] [address]. All rights reserved. This information is not intended as a substitute for professional medical care. Always follow your healthcare professional's instructions.          Chest Pain, Uncertain Cause  Chest pain can happen for a number of reasons. Sometimes the cause can not be determined. If yourcondition does not seem serious, and your pain does not appear to be coming from your heart, your doctor may recommend watching it closely. Sometimes the signs of a serious problem take more time to appear. Therefore, watch for the warning signs listed below.  Home care  After your visit, follow these recommendations:   Rest today and avoid strenuous activity.   Take any prescribed medicine as directed.  Follow-up care  Follow up with your doctor or this facility as instructed or if you do not start to feel better within 24 hours.  Call 911  Get immediate medical attention if any of the following occur:   A change in the type of pain: if it feels different, becomes more severe, lasts  longer, or begins to spread into your shoulder, arm, neck, jaw or back   Shortness of breath or increased pain with breathing   Weakness, dizziness, or fainting   Rapid heart beat  Get prompt medical attention  Call your doctor right away if any of the following occur:   Cough with dark colored sputum (phlegm) or blood   Fever of 100.38F(38C) or higher, or as directed by your health care provider   Swelling, pain or redness in one leg   2000-2015 [employer]. [address] [place] [address]. All rights reserved. This information is not intended as a substitute for professional medical care. Always follow your healthcare professional's instructions.          Pain Management     Once you're home, you may have some pain, since even minor surgery causes swelling and breakdown of tissue. When it comes to effective pain management, the tips you learned in the hospital also work at home. To get the best pain relief possible, remember these points:    Use your medication only as directed   If your pain is not relieved or if it gets worse, call your doctor.   If pain lessens, try taking your medication less often or in smaller doses.  Remember that medications need time to work   Most pain relievers taken by mouth need at least 20 to 30 minutes to take effect. They may not reach their maximum effect for close to an hour.   Take pain medication at regular times as directed. Don't wait until the pain gets bad to take it.  Time your medication   Try to time your medication so that you take it before beginning an activity, such as dressing or sitting at the table for dinner.   Taking your medication at night may help you get a good night's rest.  Eat lots of fruit and vegetables   Constipation is a common side effect with some pain medications. Eating fruit and vegetables can  help.   Drink lots of fluids.   Talk to your doctor about taking a preventive bowel regiment.  Avoid drinking  alcohol while taking pain medication   Mixing alcohol and pain medication can cause dizziness and slow your respiratory system. It can even be fatal.   Avoid driving or operating machinery while taking pain medication.  Avoid driving or operating machinery while taking pain medications that can cause drowsiness.   [address] [employer] [place], [place] [address]. All rights reserved. This information is not intended as a substitute for professional medical care. Always follow your healthcare professional's instructions.          Communicating About Pain  You have a right to have your painlooked at andtreatedas effectively as possible. Untreated pain can limit eating, sleeping, and activity. Tell your health care provider where and how much you hurt. It may not be possible to relieve all the pain.But your health care provider can help you reach a pain level you can live with.  Your Role  Tell your health care provider about the pain and your health problems. Be sure to:   Mention all the medications you take. This includes any you buy over the counter. Mention any herbs, teas, or vitamins you take, too.   Mention any pain relief techniques you use, such as massage or meditation.   Measure pain as directed by your health care provider.   If your health care provider asks you to, keep a diary of your pain, the treatments you are using and how well they work. You may also be asked to describe how strong your pain is on a scale of zero to 10. Zero is no pain and 10 is the worst pain you can imagine. Be prepared to do this for your health care provider.   Follow your treatment plan. Tell your health care provider how well treatment works.  As pain is reduced, you'll feel better. Less pain means less stress on your body and mind.  Your Health Care Provider's Role  Your health care provider will help youunderstand and manage pain. You will be told about your pain control options.  These will most likely include medications. Options like physical therapy and acupuncture may also help.     [address] [employer] [place], [place] [address]. All rights reserved. This information is not intended as a substitute for professional medical care. Always follow your healthcare professional's instructions.

## 2014-05-01 NOTE — Consults (Signed)
[facility] CONSULTATION REPORT  [facility]    Date Time: 05/01/2014 8:19 AMPatient Name: Andrew [name]  Requesting Physician: Frederich Cha, MD       Reason for Consultation:   Chest pain      History:   Giancarlos Berendt is a 59 y.o. male admitted on 04/30/2014.  We have been asked by Frederich Cha, MD,  to provide cardiac consultation. He has no history of cardiac disease but does have diabetes, HTN, hyperlipidemia, and smokes a pipe. Yesterday while moving something he developed a sharp, electric pain in his left chest radiating down his L arm. It lasted just a second or two. No SOB or syncope. He had a similar episode 5 days ago.        Past Medical History:     Past Medical History   Diagnosis Date   . Hypertension    . Diabetes mellitus    . High cholesterol        Past Surgical History:     Past Surgical History   Procedure Laterality Date   . Hernia repair       umbilical       Family History:   No premature CAD   Social History:     History     Social History   . Marital Status: Married     Spouse Name: N/A     Number of Children: N/A   . Years of Education: N/A     Social History Main Topics   . Smoking status: Current Some Day Smoker     Types: Pipe   . Smokeless tobacco: Not on file   . Alcohol Use: Yes      Comment: 1 drink per month   . Drug Use: No   . Sexual Activity: Not on file     Other Topics Concern   . Not on file     Social History Narrative   . No narrative on file       Allergies:   No Known Allergies    Medications:     Prescriptions prior to admission   Medication Sig   . atorvastatin (LIPITOR) 80 MG tablet Take 80 mg by mouth daily.   . Exenatide ER 2 MG Pen-injector Inject into the skin once a week.      Marland Kitchen lisinopril (PRINIVIL,ZESTRIL) 20 MG tablet Take 20 mg by mouth daily.   . metFORMIN (GLUCOPHAGE) 500 MG tablet Take 500 mg by mouth 2 (two) times daily with meals.   . Multiple Vitamin (MULTIVITAMIN) tablet Take 1 tablet by mouth daily.       Current  Facility-Administered Medications   Medication Dose Route Frequency Provider Last Rate Last Dose   . aspirin chewable tablet 81 mg  81 mg Oral Mare Ferrari, MD   81 mg at 04/30/14 2237   . atorvastatin (LIPITOR) tablet 80 mg  80 mg Oral Daily Frederich Cha, MD   80 mg at 04/30/14 2241   . dextrose 50 % bolus 25 mL  25 mL Intravenous PRN Frederich Cha, MD       . enoxaparin (LOVENOX) syringe 40 mg  40 mg Subcutaneous Daily Frederich Cha, MD   40 mg at 04/30/14 2241   . glucagon (rDNA) (GLUCAGEN) injection 1 mg  1 mg Intramuscular PRN Frederich Cha, MD       . lisinopril (PRINIVIL,ZESTRIL) tablet 5 mg  5 mg Oral Daily Frederich Cha, MD  Review of Systems:    Comprehensive review of systems including constitutional, eyes, ears, nose, mouth, throat, cardiovascular, GI, GU, musculoskeletal, integumentary, respiratory, neurologic, psychiatric, and endocrine is negative other than what is mentioned already in the history of present illness    Physical Exam:     Filed Vitals:    05/01/14 0430   BP: 103/56   Pulse: 80   Temp: 97.2 F (36.2 C)   Resp: 17   SpO2: 97%     Temp (24hrs), Avg:97.1 F (36.2 C), Min:96.2 F (35.7 C), Max:97.9 F (36.6 C)      Intake and Output Summary (Last 24 hours) at Date Time  No intake or output data in the 24 hours ending 05/01/14 0819    GENERAL: Patient is in no acute distress   HEENT: No scleral icterus or conjunctival pallor, moist mucous membranes   NECK: No jugular venous distention or thyromegaly, normal carotid upstrokes without bruits   CARDIAC: Normal apical impulse, regular rate and rhythm, with normal S1 and S2, and no murmurs, rubs, or gallops   CHEST: Clear to auscultation bilaterally, normal respiratory effort  ABDOMEN: No abdominal bruits, masses, or hepatosplenomegaly, nontender, non-distended, good bowel sounds   EXTREMITIES: No clubbing, cyanosis, or edema, 2+ DP, PT, and femoral pulses bilaterally without bruits  SKIN: No rash or jaundice   NEUROLOGIC: Alert and oriented  to time, place and person, normal mood and affect  MUSCULOSKELETAL: Normal muscle strength and tone.      Labs Reviewed:       Recent Labs  Lab 04/30/14  2348 04/30/14  1742 04/30/14  1101   TROPONIN I <0.01 <0.01 <0.01                           Recent Labs  Lab 05/01/14  0559 04/30/14  1100   WBC 6.47 6.40   HEMOGLOBIN 12.5* 13.6   HEMATOCRIT 35.5* 37.7*   PLATELETS 189 207       Recent Labs  Lab 05/01/14  0559 04/30/14  1100   SODIUM 140 138   POTASSIUM 4.6 4.4   CHLORIDE 111 106   CO2 24 23   BUN 12 15   CREATININE 0.7 0.8   EGFR >60.0 >60.0   GLUCOSE 167* 165*   CALCIUM 9.1 9.4         Radiology   Radiological Procedure reviewed.      chest X-ray    ECG- Normal      Assessment:    Chest pain- very atypical. Normal ECG/troponins   DM   HTN   Hyperlipidemia    Recommendations:    OK for discharge on usual meds   Exercise stress test next week.            Signed by: Vira Blanco, MD    [facility]  NP [facility] [phone] (8am-5pm)  MD [facility] [phone] (8am-5pm)  After hours, non urgent consult line [phone]  After Hours, urgent consults [phone]

## 2014-05-13 ENCOUNTER — Ambulatory Visit (INDEPENDENT_AMBULATORY_CARE_PROVIDER_SITE_OTHER): Payer: Self-pay | Admitting: Cardiovascular Disease

## 2014-11-21 ENCOUNTER — Emergency Department
Admission: EM | Admit: 2014-11-21 | Discharge: 2014-11-21 | Disposition: A | Discharge status: Home / Self Care | Payer: BLUE CROSS/BLUE SHIELD | Attending: Emergency Medical Services | Admitting: Emergency Medical Services

## 2014-11-21 ENCOUNTER — Emergency Department: Payer: BLUE CROSS/BLUE SHIELD

## 2014-11-21 DIAGNOSIS — N200 Calculus of kidney: Secondary | ICD-10-CM

## 2014-11-21 DIAGNOSIS — F17200 Nicotine dependence, unspecified, uncomplicated: Secondary | ICD-10-CM | POA: Insufficient documentation

## 2014-11-21 DIAGNOSIS — N132 Hydronephrosis with renal and ureteral calculous obstruction: Secondary | ICD-10-CM | POA: Insufficient documentation

## 2014-11-21 LAB — BASIC METABOLIC PANEL
Anion Gap: 11 (ref 5.0–15.0)
BUN: 19 mg/dL (ref 9–28)
CO2: 19 mEq/L — ABNORMAL LOW (ref 22–29)
Calcium: 9 mg/dL (ref 8.5–10.5)
Chloride: 107 mEq/L (ref 100–111)
Creatinine: 0.9 mg/dL (ref 0.7–1.3)
Glucose: 239 mg/dL — ABNORMAL HIGH (ref 70–100)
Potassium: 4.4 mEq/L (ref 3.5–5.1)
Sodium: 137 mEq/L (ref 136–145)

## 2014-11-21 LAB — URINALYSIS, REFLEX TO MICROSCOPIC EXAM IF INDICATED
Bilirubin, UA: NEGATIVE
Glucose, UA: 50 — AB
Ketones UA: NEGATIVE
Leukocyte Esterase, UA: NEGATIVE
Nitrite, UA: NEGATIVE
Protein, UR: 100 — AB
Specific Gravity UA: 1.021 (ref 1.001–1.035)
Urine pH: 6 (ref 5.0–8.0)
Urobilinogen, UA: NORMAL mg/dL

## 2014-11-21 LAB — CBC AND DIFFERENTIAL
Basophils Absolute Automated: 0.02 10*3/uL (ref 0.00–0.20)
Basophils Automated: 0 %
Eosinophils Absolute Automated: 0.14 10*3/uL (ref 0.00–0.70)
Eosinophils Automated: 2 %
Hematocrit: 32.8 % — ABNORMAL LOW (ref 42.0–52.0)
Hgb: 11.6 g/dL — ABNORMAL LOW (ref 13.0–17.0)
Immature Granulocytes Absolute: 0.03 10*3/uL
Immature Granulocytes: 0 %
Lymphocytes Absolute Automated: 0.96 10*3/uL (ref 0.50–4.40)
Lymphocytes Automated: 14 %
MCH: 30.9 pg (ref 28.0–32.0)
MCHC: 35.4 g/dL (ref 32.0–36.0)
MCV: 87.5 fL (ref 80.0–100.0)
MPV: 9.1 fL — ABNORMAL LOW (ref 9.4–12.3)
Monocytes Absolute Automated: 0.7 10*3/uL (ref 0.00–1.20)
Monocytes: 10 %
Neutrophils Absolute: 4.9 10*3/uL (ref 1.80–8.10)
Neutrophils: 73 %
Nucleated RBC: 0 /100 WBC (ref 0–1)
Platelets: 173 10*3/uL (ref 140–400)
RBC: 3.75 10*6/uL — ABNORMAL LOW (ref 4.70–6.00)
RDW: 13 % (ref 12–15)
WBC: 6.72 10*3/uL (ref 3.50–10.80)

## 2014-11-21 LAB — GFR: EGFR: >60

## 2014-11-21 MED ORDER — ONDANSETRON HCL 4 MG/2ML IJ SOLN
4.0000 mg | Freq: Once | INTRAMUSCULAR | Status: AC
Start: 2014-11-21 — End: 2014-11-21
  Administered 2014-11-21: 4 mg via INTRAVENOUS
  Filled 2014-11-21: qty 2

## 2014-11-21 MED ORDER — HYDROMORPHONE HCL 1 MG/ML IJ SOLN
1.0000 mg | Freq: Once | INTRAMUSCULAR | Status: AC
Start: 2014-11-21 — End: 2014-11-21
  Administered 2014-11-21: 1 mg via INTRAVENOUS
  Filled 2014-11-21: qty 1

## 2014-11-21 MED ORDER — OXYCODONE-ACETAMINOPHEN 5-325 MG PO TABS
ORAL_TABLET | ORAL | Status: DC
Start: 2014-11-21 — End: 2017-06-29

## 2014-11-21 MED ORDER — TAMSULOSIN HCL 0.4 MG PO CAPS
0.4000 mg | ORAL_CAPSULE | Freq: Every day | ORAL | Status: AC
Start: 2014-11-21 — End: 2014-12-05

## 2014-11-21 MED ORDER — ONDANSETRON 4 MG PO TBDP
4.0000 mg | ORAL_TABLET | Freq: Four times a day (QID) | ORAL | Status: AC | PRN
Start: 2014-11-21 — End: ?

## 2014-11-21 NOTE — Special Discharge Instructions (Signed)
I spoke to Dr. Allena Katz, he wants you to continue taking Augmentin, he wants you to call his office and make an appointment to be seen next week.  Use a strainer when urinating.  Return to ED for worsening pain, fever or any other concerns.

## 2014-11-21 NOTE — ED Provider Notes (Signed)
Physician/Midlevel provider first contact with patient: 11/21/14 0835         EMERGENCY DEPARTMENT HISTORY AND PHYSICAL EXAM    Patient Name: Andrew [name], Andrew [name]  Encounter Date:  11/21/2014  Patient DOB:  18-Jul-1955  MRN:  [id number]  Room:  03/A03    History of Presenting Illness     Chief Complaint   Patient presents with   . Flank Pain       59 y.o. male sudden onset left flank pain, nausea, vomiting, noticed streaks of blood in vomitus.  Denies chest pain, shortness of breath.  Denies fever.  Patient has had urinary tract infection for about a month  is on Augmentin.  No other complaints.    PMD:        Past Medical History     Past Medical History   Diagnosis Date   . Hypertension    . Diabetes mellitus    . High cholesterol    . UTI (urinary tract infection)    . Calculus of kidney          Home Meds     Home meds reviewed by ED MD at 8:36 AM    Discharge Medication List as of 11/21/2014 12:03 PM      CONTINUE these medications which have NOT CHANGED    Details   amoxicillin-clavulanate (AUGMENTIN) 500-125 MG per tablet Take 1 tablet by mouth 3 (three) times daily., Until Discontinued, Historical Med      glipiZIDE (GLUCOTROL) 10 MG 24 hr tablet Take 10 mg by mouth daily., Until Discontinued, Historical Med      aspirin 81 MG chewable tablet Chew 1 tablet (81 mg total) by mouth every morning., Starting 05/01/2014, Until Discontinued, Normal      atorvastatin (LIPITOR) 80 MG tablet Take 80 mg by mouth daily., Until Discontinued, Historical Med      Exenatide ER 2 MG Pen-injector Inject into the skin once a week.   , Until Discontinued, Historical Med      lisinopril (PRINIVIL,ZESTRIL) 20 MG tablet Take 20 mg by mouth daily., Until Discontinued, Historical Med      metFORMIN (GLUCOPHAGE) 500 MG tablet Take 500 mg by mouth 2 (two) times daily with meals., Until Discontinued, Historical Med      Multiple Vitamin (MULTIVITAMIN) tablet Take 1 tablet by mouth daily., Until Discontinued, Historical Med                ED Meds  Administered     Medications   HYDROmorphone (DILAUDID) injection 1 mg (1 mg Intravenous Given 11/21/14 1113)   ondansetron (ZOFRAN) injection 4 mg (4 mg Intravenous Given 11/21/14 1113)         Past Surgical History     Past Surgical History   Procedure Laterality Date   . Hernia repair       umbilical         Family History     History reviewed. No pertinent family history.    Social History     Social History     Social History   . Marital Status: Married     Spouse Name: N/A   . Number of Children: N/A   . Years of Education: N/A     Social History Main Topics   . Smoking status: Current Some Day Smoker     Types: Pipe   . Smokeless tobacco: Not on file   . Alcohol Use: Yes      Comment: 1 drink per month   .  Drug Use: No   . Sexual Activity: Not on file     Other Topics Concern   . Not on file     Social History Narrative       Review of Systems     Constitutional:  No fever  Eyes:   ENT:   CV:  No CP   Resp:  No cough,No SOB   GI: No abd pain, Nausea, Vomtining, Diarrhea  GU: left flank pain  No dysuria  MS:  No pain  Skin: No rash  Neuro:  No HA  Psych:  No behavior changes  All other systems reviewed and negative    Physical Exam     Constitutional: Vital signs reviewed. Well appearing.  Head: Normocephalic, atraumatic  Eyes: Conjunctiva and sclera are normal.   Ears, Nose, Throat:  Normal external examination of the nose and ears.    Neck:Trachea midline,supple, Normal range of motion.   Respiratory/Chest: Clear to auscultation. No respiratory distress.   Cardiovascular: Regular rate and rhythm. No murmurs.   Abdomen:Non distended.Soft,non-tender. No guarding. No rebound. No masses.  Back:  No  CVA tenderness  Upper Extremity:  No edema. No cyanosis.  Lower Extremity:  No edema. No cyanosis.  Neurological:  Alert and conversant.  No focal motor deficits by observation. Speech normal.  Skin: Warm and dry. No rash.  Psychiatric:  Normal affect.  Normal insight.              Orders Placed During This Encounter      Orders Placed This Encounter   Procedures   . Urine culture   . CT Abdomen Pelvis WO IV/ WO PO Cont   . Basic Metabolic Panel   . CBC with differential   . UA with reflex to micro (all hospital ED's and [facility], pts 3 + yrs)   . GFR   . Saline lock IV       Diagnostic Study Results     The results of the diagnostic studies below were reviewed by the ED provider:    Labs  Results     Procedure Component Value Units Date/Time    Basic Metabolic Panel [[id number]]  (Abnormal) Collected:  11/21/14 0910    Specimen Information:  Blood Updated:  11/21/14 0934     Glucose 239 (H) mg/dL      BUN 19 mg/dL      Creatinine 0.9 mg/dL      Calcium 9.0 mg/dL      Sodium 409 mEq/L      Potassium 4.4 mEq/L      Chloride 107 mEq/L      CO2 19 (L) mEq/L      Anion Gap 11.0     GFR [[id number]] Collected:  11/21/14 0910     EGFR >60.0 Updated:  11/21/14 0934    UA with reflex to micro (all hospital ED's and [facility], pts 3 + yrs) [[id number]]  (Abnormal) Collected:  11/21/14 0910    Specimen Information:  Urine Updated:  11/21/14 5784     Urine Type Clean Catch      Color, UA Yellow      Clarity, UA Clear      Specific Gravity UA 1.021      Urine pH 6.0      Leukocyte Esterase, UA Negative      Nitrite, UA Negative      Protein, UR 100 (A)      Glucose, UA 50 (A)  Ketones UA Negative      Urobilinogen, UA Normal mg/dL      Bilirubin, UA Negative      Blood, UA Large (A)      RBC, UA TNTC (A) /hpf      WBC, UA 6 - 10 (A) /hpf      Calcium Oxalate Crystals, UA Rare /hpf     CBC with differential [[id number]]  (Abnormal) Collected:  11/21/14 0910    Specimen Information:  Blood from Blood Updated:  11/21/14 0922     WBC 6.72 x10 3/uL      Hgb 11.6 (L) g/dL      Hematocrit 40.9 (L) %      Platelets 173 x10 3/uL      RBC 3.75 (L) x10 6/uL      MCV 87.5 fL      MCH 30.9 pg      MCHC 35.4 g/dL      RDW 13 %      MPV 9.1 (L) fL      Neutrophils 73 %      Lymphocytes Automated 14 %      Monocytes 10 %       Eosinophils Automated 2 %      Basophils Automated 0 %      Immature Granulocyte 0 %      Nucleated RBC 0 /100 WBC      Neutrophils Absolute 4.90 x10 3/uL      Abs Lymph Automated 0.96 x10 3/uL      Abs Mono Automated 0.70 x10 3/uL      Abs Eos Automated 0.14 x10 3/uL      Absolute Baso Automated 0.02 x10 3/uL      Absolute Immature Granulocyte 0.03 x10 3/uL     Urine culture [[id number]] Collected:  11/21/14 0910    Specimen Information:  Urine from Urine, Clean Catch Updated:  11/21/14 0910          Radiologic Studies  Radiology Results (24 Hour)     Procedure Component Value Units Date/Time    CT Abdomen Pelvis WO IV/ WO PO Cont [[id number]] Collected:  11/21/14 0935    Order Status:  Completed Updated:  11/21/14 0945    Narrative:      Reason for exam: 59 year old male with left flank pain.    TECHNIQUE: Spiral CT without contrast.    FINDINGS:  There is a 6 mm obstructing calculus in the left proximal-mid ureter  overlying the left lumbar spine transverse process at L4. This stone is  radiopaque on the topogram. It results in moderate proximal left-sided  hydronephrosis. The obstructed left kidney is edematous with minimal  perinephric stranding. 2 additional tiny 1 mm calculi are seen in the  lower pole of the left kidney. A dominant 5.5 cm cystic lesion projects  off the upper pole left kidney.    Elsewhere, limited noncontrast evaluation of the liver, spleen,  pancreas, and adrenal glands is unremarkable. Small calculi are seen  within a contracted gallbladder lumen. The bile ducts are normal  caliber. Intestine reveals no thickening or perceived inflammation.  There is moderate stool in the colon. A few diverticulum are seen in the  sigmoid colon. There is no diverticulitis. Appendix is not localized.    There is no significant lymphadenopathy or ascites. Abdominal aorta is  normal caliber. There are no significant hernias in the supine position.  The bladder is decompressed. Seminal vesicles are prominent.  There are  numerous pelvic  phleboliths.    Arthritis is seen in the spine and SI joints. Lung bases are clear.      Impression:       There is a 6 mm obstructing calculus in the left  proximal-mid ureter, resulting in moderate proximal left-sided  hydronephrosis. Remainder as above.    Wilmon Pali, MD   11/21/2014 9:41 AM            Monitors, EKG, Critical Care, and Splints   Cardiac Monitor (interpreted by ED physician):    EKG (interpreted by ED physician):   Critical Care:   Splint check:      Visit Vital Signs     Patient Vitals for the past 24 hrs:   BP Temp Temp src Pulse Resp SpO2 Height Weight   11/21/14 1141 138/71 mmHg - - 78 18 97 % - -   11/21/14 1056 148/80 mmHg - - 78 18 96 % - -   11/21/14 0832 142/78 mmHg 98.1 F (36.7 C) Oral 84 18 95 % 5\' 11"  (1.803 m) 91.173 kg         MDM and Clinical Notes   59 year old male, nontoxic appearing, sudden left flank pain, nausea, vomiting.  No fever, nontender, patient on Augmentin for urinary tract infection  Plan: Labs, CT for stone  11:10 AM discussed with Allena Katz, urologist, prescribe 30 Percocets for the patient.  Flomax 0.4 mg daily strainer, have the patient call the office to be seen next week.  Continue Augmentin.  At discharge, patient is pain-free.  Discussed at length with patient to return to ED for worsening pain, fever, back pain, nausea, vomiting and unable take medication including antibiotics or any other concerns.    Diagnosis and Disposition     Clinical Impression  Kidney stone      Disposition  Stable, home      Discharge Medication List as of 11/21/2014 12:03 PM      START taking these medications    Details   ondansetron (ZOFRAN ODT) 4 MG disintegrating tablet Take 1 tablet (4 mg total) by mouth every 6 (six) hours as needed for Nausea., Starting 11/21/2014, Until Discontinued, Print      oxyCODONE-acetaminophen (PERCOCET) 5-325 MG per tablet 1-2 tablets by mouth every 4-6 hours as needed for pain;  Do not drive or operate machinery while taking  this medicine, Print      tamsulosin (FLOMAX) 0.4 MG Cap Take 1 capsule (0.4 mg total) by mouth daily. Discontinue for lightheadedness, Starting 11/21/2014, Until Fri 12/05/14, Print                   Jonathon Resides Nichols, [place]  11/21/14 1419

## 2014-11-21 NOTE — Discharge Instructions (Signed)
Dear  Andrew [name]:    I appreciate your choosing the [name] for your healthcare needs, and hope your visit today was EXCELLENT.    Instructions:  Please follow-up with Dr. Allena Katz    Return to the Emergency Department for any worsening symptoms or concerns.    Below is some information that our patients often find helpful.    We wish you good health and please do not hesitate to contact us if we can ever be of any assistance.    Sincerely,  Deno Etienne, PA-C  Pamala Hurry, MD  [facility]    ________________________________________________________________    If you do not continue to improve or your condition worsens, please contact your doctor or return immediately to the Emergency Department.    Thank you for choosing [facility] for your emergency care needs.  We strive to provide EXCELLENT care to you and your family.      DOCTOR REFERRALS  Call [phone] if you need any further referrals and we can help you find a primary care doctor or specialist.  Also, available online at:  [email or web address]    YOUR CONTACT INFORMATION  Before leaving please check with registration to make sure we have an up-to-date contact number.  You can call registration at [phone] to update your information.  For questions about your hospital bill, please call [phone].  For questions about your Emergency Dept Physician bill please call [phone].      FREE HEALTH SERVICES  If you need help with health or social services, please call 2-1-1 for a free referral to resources in your area.  2-1-1 is a free service connecting people with information on health insurance, free clinics, pregnancy, mental health, dental care, food assistance, housing, and substance abuse counseling.  Also, available online at:  http://www.[facility].org    MEDICAL RECORDS AND TESTS  Certain laboratory test results do not come back the same day,  for example urine cultures.   We will contact you if other important findings are noted.  Radiology films are often reviewed again to ensure accuracy.  If there is any discrepancy, we will notify you.      Please call [phone] to pick up a complimentary CD of any radiology studies performed.  If you or your doctor would like to request a copy of your medical records, please call [phone].      ORTHOPEDIC INJURY   Please know that significant injuries can exist even when an initial x-ray is read as normal or negative.  This can occur because some fractures (broken bones) are not initially visible on x-rays.  For this reason, close outpatient follow-up with your primary care doctor or bone specialist (orthopedist) is required.    MEDICATIONS AND FOLLOWUP  Please be aware that some prescription medications can cause drowsiness.  Use caution when driving or operating machinery.    The examination and treatment you have received in our Emergency Department is provided on an emergency basis, and is not intended to be a substitute for your primary care physician.  It is important that your doctor checks you again and that you report any new or remaining problems at that time.      24 HOUR PHARMACIES  [facility] - [address], [place], [place] [address] (1.4 miles, 7 minutes)  [facility] - [address], [place], [place] [address] (6.5 miles, 13 minutes)  Handout with directions available  on request

## 2014-11-21 NOTE — ED Notes (Addendum)
Pt with month long treatment for UTI. Pt was switched from one abx (which is unknown) to Augmentin. Pt states this am around 0600 began with sudden onset left flank pain and states also with sudden onset vomiting with bright red streaks in saliva. Pt states had been lifting 30 pound cans of oil this am. Pt states no fevers at home. Pt states last urinated last night at 2300 and has been drinking water but has no urge to pee. Pt arrives by EMS with an IV 20 gauge to the left ac. Pt with hx of kidney stones but states this feels different.  Pt received 30 mg of Toradol and 4 mg of zofran by EMS. Pt in NAD and a+oX3.

## 2014-11-21 NOTE — ED Notes (Signed)
Bed: A03  Expected date:   Expected time:   Means of arrival:   Comments:  Medic 303

## 2014-11-24 ENCOUNTER — Other Ambulatory Visit: Payer: Self-pay

## 2014-11-24 ENCOUNTER — Emergency Department: Payer: BLUE CROSS/BLUE SHIELD | Admitting: Anesthesiology

## 2014-11-24 ENCOUNTER — Ambulatory Visit: Payer: BLUE CROSS/BLUE SHIELD | Admitting: Urology

## 2014-11-24 ENCOUNTER — Encounter
Admission: EM | Disposition: A | Discharge status: Home / Self Care | Payer: Self-pay | Source: Home / Self Care | Attending: Emergency Medicine

## 2014-11-24 ENCOUNTER — Ambulatory Visit
Admission: EM | Admit: 2014-11-24 | Discharge: 2014-11-24 | Disposition: A | Discharge status: Home / Self Care | Payer: BLUE CROSS/BLUE SHIELD | Attending: Emergency Medicine | Admitting: Emergency Medicine

## 2014-11-24 ENCOUNTER — Emergency Department: Payer: BLUE CROSS/BLUE SHIELD

## 2014-11-24 DIAGNOSIS — E780 Pure hypercholesterolemia: Secondary | ICD-10-CM | POA: Insufficient documentation

## 2014-11-24 DIAGNOSIS — N201 Calculus of ureter: Secondary | ICD-10-CM | POA: Insufficient documentation

## 2014-11-24 DIAGNOSIS — N200 Calculus of kidney: Secondary | ICD-10-CM

## 2014-11-24 DIAGNOSIS — E119 Type 2 diabetes mellitus without complications: Secondary | ICD-10-CM | POA: Insufficient documentation

## 2014-11-24 DIAGNOSIS — I10 Essential (primary) hypertension: Secondary | ICD-10-CM | POA: Insufficient documentation

## 2014-11-24 HISTORY — PX: CYSTOSCOPY, URETEROSCOPY, URETERAL STONE REMOVAL/MANIPULATION, LASER: SHX3636

## 2014-11-24 LAB — URINALYSIS, REFLEX TO MICROSCOPIC EXAM IF INDICATED
Bilirubin, UA: NEGATIVE
Blood, UA: NEGATIVE
Glucose, UA: NEGATIVE
Ketones UA: NEGATIVE
Leukocyte Esterase, UA: NEGATIVE
Nitrite, UA: NEGATIVE
Protein, UR: NEGATIVE
Specific Gravity UA: 1.004 (ref 1.001–1.035)
Urine pH: 6 (ref 5.0–8.0)
Urobilinogen, UA: NORMAL mg/dL

## 2014-11-24 LAB — ECG 12-LEAD
Atrial Rate: 77 {beats}/min
P Axis: 46 degrees
P-R Interval: 148 ms
Q-T Interval: 376 ms
QRS Duration: 88 ms
QTC Calculation (Bezet): 425 ms
R Axis: -19 degrees
T Axis: 39 degrees
Ventricular Rate: 77 {beats}/min

## 2014-11-24 LAB — GFR: EGFR: >60

## 2014-11-24 LAB — GLUCOSE WHOLE BLOOD - POCT
Whole Blood Glucose POCT: 111 mg/dL — ABNORMAL HIGH (ref 70–100)
Whole Blood Glucose POCT: 107 mg/dL — ABNORMAL HIGH (ref 70–100)

## 2014-11-24 LAB — BASIC METABOLIC PANEL
BUN: 9 mg/dL (ref 9.0–28.0)
CO2: 23 mEq/L (ref 22–29)
Calcium: 9.9 mg/dL (ref 8.5–10.5)
Chloride: 106 mEq/L (ref 100–111)
Creatinine: 0.8 mg/dL (ref 0.7–1.3)
Glucose: 191 mg/dL — ABNORMAL HIGH (ref 70–100)
Potassium: 4.1 mEq/L (ref 3.5–5.1)
Sodium: 141 mEq/L (ref 136–145)

## 2014-11-24 SURGERY — CYSTOSCOPY, URETEROSCOPY, URETERAL STONE REMOVAL/MANIPULATION, LASER
Anesthesia: Anesthesia General | Site: Pelvis | Laterality: Left | Wound class: Clean Contaminated

## 2014-11-24 MED ORDER — FENTANYL CITRATE (PF) 50 MCG/ML IJ SOLN (WRAP)
INTRAMUSCULAR | Status: DC | PRN
Start: 2014-11-24 — End: 2014-11-24
  Administered 2014-11-24: 25 ug via INTRAVENOUS
  Administered 2014-11-24: 50 ug via INTRAVENOUS
  Administered 2014-11-24: 25 ug via INTRAVENOUS

## 2014-11-24 MED ORDER — MEPERIDINE HCL 25 MG/ML IJ SOLN
12.5000 mg | INTRAMUSCULAR | Status: DC | PRN
Start: 2014-11-24 — End: 2014-11-24

## 2014-11-24 MED ORDER — IBUPROFEN 400 MG PO TABS
600.0000 mg | ORAL_TABLET | Freq: Once | ORAL | Status: DC | PRN
Start: 2014-11-24 — End: 2014-11-24

## 2014-11-24 MED ORDER — ONDANSETRON HCL 4 MG/2ML IJ SOLN
INTRAMUSCULAR | Status: AC
Start: 2014-11-24 — End: ?
  Filled 2014-11-24: qty 2

## 2014-11-24 MED ORDER — LACTATED RINGERS IV SOLN
INTRAVENOUS | Status: DC
Start: 2014-11-24 — End: 2014-11-24
  Administered 2014-11-24: 1000 mL via INTRAVENOUS

## 2014-11-24 MED ORDER — GLYCOPYRROLATE 0.2 MG/ML IJ SOLN
INTRAMUSCULAR | Status: DC | PRN
Start: 2014-11-24 — End: 2014-11-24
  Administered 2014-11-24: 0.2 mg via INTRAVENOUS

## 2014-11-24 MED ORDER — FENTANYL CITRATE (PF) 50 MCG/ML IJ SOLN (WRAP)
50.0000 ug | INTRAMUSCULAR | Status: DC | PRN
Start: 2014-11-24 — End: 2014-11-24

## 2014-11-24 MED ORDER — ACETAMINOPHEN 500 MG PO TABS
1000.0000 mg | ORAL_TABLET | Freq: Once | ORAL | Status: DC | PRN
Start: 2014-11-24 — End: 2014-11-24

## 2014-11-24 MED ORDER — CEFAZOLIN SODIUM-DEXTROSE 2-3 GM-% IV SOLR
INTRAVENOUS | Status: DC
Start: 2014-11-24 — End: 2014-11-24
  Filled 2014-11-24: qty 50

## 2014-11-24 MED ORDER — CEFAZOLIN SODIUM-DEXTROSE 2-3 GM-% IV SOLR
2.0000 g | Freq: Once | INTRAVENOUS | Status: AC
Start: 2014-11-24 — End: 2014-11-24
  Administered 2014-11-24: 2 g via INTRAVENOUS

## 2014-11-24 MED ORDER — OXYCODONE-ACETAMINOPHEN 5-325 MG PO TABS
1.0000 | ORAL_TABLET | ORAL | Status: AC | PRN
Start: 2014-11-24 — End: 2014-12-04

## 2014-11-24 MED ORDER — HYDROMORPHONE HCL 1 MG/ML IJ SOLN
0.5000 mg | Freq: Once | INTRAMUSCULAR | Status: AC
Start: 2014-11-24 — End: 2014-11-24
  Administered 2014-11-24: 0.5 mg via INTRAVENOUS
  Filled 2014-11-24: qty 1

## 2014-11-24 MED ORDER — PHENAZOPYRIDINE HCL 200 MG PO TABS
ORAL_TABLET | ORAL | Status: AC
Start: 2014-11-24 — End: 2014-11-24
  Administered 2014-11-24: 200 mg via ORAL
  Filled 2014-11-24: qty 1

## 2014-11-24 MED ORDER — ONDANSETRON HCL 4 MG/2ML IJ SOLN
4.0000 mg | Freq: Once | INTRAMUSCULAR | Status: DC | PRN
Start: 2014-11-24 — End: 2014-11-24

## 2014-11-24 MED ORDER — FENTANYL CITRATE (PF) 50 MCG/ML IJ SOLN (WRAP)
INTRAMUSCULAR | Status: AC
Start: 2014-11-24 — End: ?
  Filled 2014-11-24: qty 2

## 2014-11-24 MED ORDER — ONDANSETRON HCL 4 MG/2ML IJ SOLN
4.0000 mg | Freq: Once | INTRAMUSCULAR | Status: AC
Start: 2014-11-24 — End: 2014-11-24
  Administered 2014-11-24: 4 mg via INTRAVENOUS
  Filled 2014-11-24: qty 2

## 2014-11-24 MED ORDER — PHENYLEPHRINE 100 MCG/ML IN NACL 0.9% IV SOSY
PREFILLED_SYRINGE | INTRAVENOUS | Status: AC
Start: 2014-11-24 — End: ?
  Filled 2014-11-24: qty 5

## 2014-11-24 MED ORDER — HYDROMORPHONE HCL 2 MG PO TABS
2.0000 mg | ORAL_TABLET | Freq: Once | ORAL | Status: DC | PRN
Start: 2014-11-24 — End: 2014-11-24

## 2014-11-24 MED ORDER — EPHEDRINE SULFATE 50 MG/ML IJ SOLN
INTRAMUSCULAR | Status: DC | PRN
Start: 2014-11-24 — End: 2014-11-24
  Administered 2014-11-24 (×2): 10 mg via INTRAVENOUS
  Administered 2014-11-24 (×2): 5 mg via INTRAVENOUS
  Administered 2014-11-24: 10 mg via INTRAVENOUS

## 2014-11-24 MED ORDER — PROPOFOL 10 MG/ML IV EMUL (WRAP)
INTRAVENOUS | Status: AC
Start: 2014-11-24 — End: ?
  Filled 2014-11-24: qty 20

## 2014-11-24 MED ORDER — ONDANSETRON HCL 4 MG/2ML IJ SOLN
INTRAMUSCULAR | Status: DC | PRN
Start: 2014-11-24 — End: 2014-11-24
  Administered 2014-11-24: 4 mg via INTRAVENOUS

## 2014-11-24 MED ORDER — LIDOCAINE HCL (PF) 2 % IJ SOLN
INTRAMUSCULAR | Status: AC
Start: 2014-11-24 — End: ?
  Filled 2014-11-24: qty 5

## 2014-11-24 MED ORDER — GLYCOPYRROLATE 0.2 MG/ML IJ SOLN
INTRAMUSCULAR | Status: AC
Start: 2014-11-24 — End: ?
  Filled 2014-11-24: qty 1

## 2014-11-24 MED ORDER — PROMETHAZINE HCL 25 MG/ML IJ SOLN
6.2500 mg | Freq: Once | INTRAMUSCULAR | Status: DC | PRN
Start: 2014-11-24 — End: 2014-11-24

## 2014-11-24 MED ORDER — HYDROMORPHONE HCL 1 MG/ML IJ SOLN
0.5000 mg | INTRAMUSCULAR | Status: DC | PRN
Start: 2014-11-24 — End: 2014-11-24

## 2014-11-24 MED ORDER — IOVERSOL 68 % IJ SOLN
INTRAMUSCULAR | Status: DC | PRN
Start: 2014-11-24 — End: 2014-11-24
  Administered 2014-11-24: 50 mL via INTRAVENOUS

## 2014-11-24 MED ORDER — LACTATED RINGERS IV SOLN
125.0000 mL/h | INTRAVENOUS | Status: DC
Start: 2014-11-24 — End: 2014-11-24

## 2014-11-24 MED ORDER — SODIUM CHLORIDE 0.9% BAG (IRRIGATION USE)
INTRAVENOUS | Status: DC | PRN
Start: 2014-11-24 — End: 2014-11-24
  Administered 2014-11-24: 3000 mL

## 2014-11-24 MED ORDER — MIDAZOLAM HCL 2 MG/2ML IJ SOLN
INTRAMUSCULAR | Status: DC | PRN
Start: 2014-11-24 — End: 2014-11-24
  Administered 2014-11-24: 2 mg via INTRAVENOUS

## 2014-11-24 MED ORDER — PHENAZOPYRIDINE HCL 200 MG PO TABS
200.0000 mg | ORAL_TABLET | Freq: Once | ORAL | Status: AC
Start: 2014-11-24 — End: 2014-11-24

## 2014-11-24 MED ORDER — OXYBUTYNIN CHLORIDE 5 MG PO TABS
5.0000 mg | ORAL_TABLET | Freq: Three times a day (TID) | ORAL | Status: DC | PRN
Start: 2014-11-24 — End: 2014-11-24

## 2014-11-24 MED ORDER — CIPROFLOXACIN HCL 500 MG PO TABS
500.0000 mg | ORAL_TABLET | Freq: Two times a day (BID) | ORAL | 0 refills | Status: AC
Start: 2014-11-24 — End: 2014-11-29
  Filled 2014-11-24: qty 10, 5d supply, fill #0

## 2014-11-24 MED ORDER — OXYCODONE-ACETAMINOPHEN 5-325 MG PO TABS
ORAL_TABLET | ORAL | 0 refills | Status: DC
Start: 2014-11-24 — End: 2017-06-29
  Filled 2014-11-24: qty 30, 3d supply, fill #0

## 2014-11-24 MED ORDER — DEXAMETHASONE SODIUM PHOSPHATE 20 MG/5ML IJ SOLN
INTRAMUSCULAR | Status: AC
Start: 2014-11-24 — End: ?
  Filled 2014-11-24: qty 5

## 2014-11-24 MED ORDER — LIDOCAINE HCL 2 % IJ SOLN
INTRAMUSCULAR | Status: DC | PRN
Start: 2014-11-24 — End: 2014-11-24
  Administered 2014-11-24: 100 mg

## 2014-11-24 MED ORDER — FAMOTIDINE 20 MG/2ML IV SOLN
INTRAVENOUS | Status: AC
Start: 2014-11-24 — End: ?
  Filled 2014-11-24: qty 2

## 2014-11-24 MED ORDER — PROPOFOL INFUSION 10 MG/ML
INTRAVENOUS | Status: DC | PRN
Start: 2014-11-24 — End: 2014-11-24
  Administered 2014-11-24: 200 mg via INTRAVENOUS

## 2014-11-24 MED ORDER — PHENYLEPHRINE HCL 10 MG/ML IV SOLN (WRAP)
Status: DC | PRN
Start: 2014-11-24 — End: 2014-11-24
  Administered 2014-11-24 (×2): 50 ug via INTRAVENOUS
  Administered 2014-11-24 (×2): 100 ug via INTRAVENOUS

## 2014-11-24 MED ORDER — OXYBUTYNIN CHLORIDE ER 5 MG PO TB24
5.0000 mg | ORAL_TABLET | Freq: Every day | ORAL | 0 refills | Status: AC | PRN
Start: 2014-11-24 — End: ?
  Filled 2014-11-24: qty 30, 30d supply, fill #0

## 2014-11-24 MED ORDER — SODIUM CHLORIDE 0.9 % IV BOLUS
1000.0000 mL | Freq: Once | INTRAVENOUS | Status: AC
Start: 2014-11-24 — End: 2014-11-24
  Administered 2014-11-24: 1000 mL via INTRAVENOUS

## 2014-11-24 MED ORDER — EPHEDRINE SULFATE 50 MG/ML IJ SOLN
INTRAMUSCULAR | Status: AC
Start: 2014-11-24 — End: ?
  Filled 2014-11-24: qty 1

## 2014-11-24 MED ORDER — MIDAZOLAM HCL 2 MG/2ML IJ SOLN
INTRAMUSCULAR | Status: AC
Start: 2014-11-24 — End: ?
  Filled 2014-11-24: qty 2

## 2014-11-24 MED ORDER — HYDROCODONE-ACETAMINOPHEN 5-325 MG PO TABS
1.0000 | ORAL_TABLET | ORAL | Status: DC | PRN
Start: 2014-11-24 — End: 2014-11-24

## 2014-11-24 MED ORDER — PHENAZOPYRIDINE HCL 200 MG PO TABS
200.0000 mg | ORAL_TABLET | Freq: Three times a day (TID) | ORAL | 0 refills | Status: AC | PRN
Start: 2014-11-24 — End: 2014-12-04
  Filled 2014-11-24: qty 30, 10d supply, fill #0

## 2014-11-24 SURGICAL SUPPLY — 30 items
BASKET STON RTRVL HLCL GMN 11MM 3FR 120 (Urology Supply)
BASKET STON RTRVL NTNL 0TP 12MM 1.9FR (Endoscopic Supplies)
BASKET STONE RETRIEVAL L120 CM OD11 MM (Urology Supply)
BASKET STONE RETRIEVAL L120 CM OD11 MM ODSEC3 FR GEMINI URETERAL 3 (Urology Supply) IMPLANT
BASKET STONE RETRIEVAL L120 CM OD12 MM (Endoscopic Supplies)
BASKET STONE RETRIEVAL L120 CM OD12 MM ODSEC1.9 FR ZERO TIP URETERAL 4 (Endoscopic Supplies) IMPLANT
CATHETER URET 2 LUM 10FX50CM (Catheter Micellaneous)
CATHETER URET 2 LUM 10FX50CM (Catheter Miscellaneous) IMPLANT
CATHETER URET FLXM 5FR 70CM LF STRL 1 (Catheter Urine) ×1
CATHETER URETERAL FLEXIMA OD5 FR L70 CM (Catheter Urine) ×1
CATHETER URETERAL FLEXIMA OD5 FR L70 CM 1 LUMEN INJECTION HUB (Catheter Urine) ×1 IMPLANT
GLOVE SURG BIOGEL PF LTX SZ7.0 (Glove) ×2 IMPLANT
LASER FIBER SUREFLEX 273 IQ (Laser Supplies) IMPLANT
LASER FIBER SUREFLEX 365 IQ (Laser Supplies) ×2 IMPLANT
LASER FIBER SUREFLEX 550 IQ (Laser Supplies) IMPLANT
SLEEVE SEQUEN COMP KNEE REG (Procedure Accessories) ×2 IMPLANT
SOL BETADINE SOLUTION 4 OZ (Prep) ×2 IMPLANT
SOL NACL .9% IRRIG 250ML NLTX (IV Solutions) ×1
SOLUTION IRR 0.9% NACL 1000ML LF STRL (Irrigation Solutions) ×1
SOLUTION IRRIGATION 0.9% SODIUM CHLORIDE (IV Solutions) ×1
SOLUTION IRRIGATION 0.9% SODIUM CHLORIDE (Irrigation Solutions) ×1
SOLUTION IRRIGATION 0.9% SODIUM CHLORIDE 1000 ML PLASTIC POUR BOTTLE (Irrigation Solutions) ×1 IMPLANT
SOLUTION IRRIGATION 0.9% SODIUM CHLORIDE 250 ML PLASTIC POUR BOTTLE (IV Solutions) ×1 IMPLANT
STENT PERCUFLEX PLUS HYDROPLUS OD6 FR (Stent) ×1 IMPLANT
STENT PERCUFLEX PLUS HYDROPLUS OD6 FR L26 CM PERCUFLEX TAPER TIP (Stent) IMPLANT
STENT PERCUFLEX PLUS HYDROPLUS OD6 FR L26 CM PERCUFLEXâ„¢ TAPER TIP (Stent) ×1 IMPLANT
TRAY CYSTOSCOPY PACK (Pack) ×2 IMPLANT
WATER STERILE PLASTIC CONTAINER 3000 ML (Irrigation Solutions) ×1 IMPLANT
WATER STRL 3L URMTC LF PLS CNTNR (Irrigation Solutions) ×1
WIRE GLIDE FLX .038INX150CM HY (Glide Wire) ×2 IMPLANT

## 2014-11-24 NOTE — ED Notes (Signed)
See quick triage note.

## 2014-11-24 NOTE — Anesthesia Preprocedure Evaluation (Signed)
Anesthesia Evaluation    AIRWAY    Mallampati: II    TM distance: >3 FB  Neck ROM: full  Mouth Opening:full   CARDIOVASCULAR           DENTAL         PULMONARY         OTHER FINDINGS                      Anesthesia Plan    ASA 2 - emergent     general

## 2014-11-24 NOTE — Op Note (Signed)
Procedure Date: 11/24/2014     Patient Type: A     SURGEON: Jobe Gibbon MD  ASSISTANT:       PREOPERATIVE DIAGNOSIS:  Left ureteral stone.     POSTOPERATIVE DIAGNOSIS:  Left ureteral stone.     TITLE OF PROCEDURES:  1.  Cystoscopy.  2.  Left ureteroscopy.  3.  Laser lithotripsy.  4.  Stone IT consultant.  5.  Left ureteral stent insertion.  6.  Left retrograde pyelogram.     ANESTHESIA:  General.     INDICATIONS:  Mr. Andrew [name] is a 59 year old male who presented again to the emergency room today.  He had presented to the emergency room approximately 3 to 4 days ago complaining  of left back pain.  Upon evaluation, he was found to have a 6-mm stone. He was doing fine until today. Today, he was having severe pain, nausea, and vomiting.  Options were discussed with the patient, including the risks and benefits.  After  answering all of his questions, he wished to proceed with a cystoscopy,  left ureteroscopy, laser lithotripsy, retrograde pyelogram, and stent  insertion.  Again, the risks and benefits were reviewed and all questions  were answered.  This procedure was done on an emergent basis due to his  symptoms.     DESCRIPTION OF PROCEDURE:  The patient was initially met in the preoperative holding area and informed  consent was obtained.  Antibiotics were administered.  Sequential devices  were placed on the lower extremities.  He was then brought to the operating  room and placed in lithotomy position.  Appropriate padding was placed to  all pressure points.  Once comfort was expressed, anesthesia was  administered.  The genitalia and groin was then prepped and draped in  standard surgical fashion.     A 22-French, 30-degree cystoscope was placed per urethra and into the  bladder without any difficulty.  The urethra was within normal limits.  The  bladder was also within normal limits.  There were no masses, tumors,  stones, fistulae or diverticula.  Both ureteral orifices were in normal  anatomical  position.     A 5-French open-ended catheter was placed into the left ureteral orifice  and a left retrograde pyelogram was performed under fluoroscopy.  The stone  was identified in the distal left ureter.     A 0.35 Sensor guidewire was placed through the ureteral catheter and into  the left collecting system.  It was easily able to be negotiated past the  stone and into the left renal pelvis.  The ureteral catheter and the  cystoscope were then removed.     Alongside the guidewire, a rigid ureteroscope was passed up to the level of  the stone.  A second 0.35 Sensor Guidewire was placed through the  ureteroscope and into the left collecting system to easily cannulate the  left ureteral orifice.  The stone was identified again in the left distal  ureter.  It was approximately 6 to 7 mm in size.  A holmium laser fiber was  then used to fragment the stone into tiny pieces.  A tipless stone basket  was then used to remove all of those pieces.  One final look was obtained  up to the mid portion of the left ureter.  There were no other stones or  stone fragments seen.  There was no injury to the ureter.  There were no  lesions seen.  Contrast was reinjected through the  ureteroscope to perform  a left retrograde pyelogram to ensure proper placement of the stent.   Again, there was no filling defect in the ureter, and there was no evidence  of any extravasation.  The ureteroscope was then removed under direct  visualization.     The remaining Guidewire was backloaded through the cystoscope, and the  cystoscope was placed into the bladder.  A 6-French 26-cm double-J ureteral  stent was placed in the left collecting system.  Once in proper position,  the Guidewire was removed.  There was a good curl in the left renal pelvis  under fluoroscopy and under direct vision in the bladder.     The bladder was then drained, and the cystoscope was removed.     The patient tolerated the procedure well.  He was extubated in the OR.   He  was then brought to the recovery room in stable condition.           D:  11/24/2014 16:51 PM by Dr. Jerilynn Birkenhead. [place]ena Katz, MD ([id number])  T:  11/24/2014 19:08 PM by       Everlean Cherry: [id number]) (Doc ID: [id number])

## 2014-11-24 NOTE — ED Provider Notes (Signed)
Attending note at 9:06 AM by me.   Hx reviewed, agree. 9/9 with 6mm stone at [facility].    Pert PE -   L flank tend to perc.  Abd NT.  Test NT.  A/P R/O infect.  Check position of stone with KUB.  Call placed to [facility]    9:16 AM Spoke with Dr. Allena Katz, urology.     EKG reviewed and interpreted by me: NSR 77, nl axis, nl intervals, no acute ST-T changes. Impression: normal EKG     Disposition:  ED Disposition     Send to [facility] OR Dr. Malachi Pro, urology, accepting.             Diagnosis:  Final diagnoses:   Kidney stone     I was acting as a scribe for Gladine Plude, Channing Mutters, MD on [name],Andrew [name]  Merton Border    Treatment Team: Scribe: Merton Border is scribing for me on Manganiello,Andrew [name]. This note and the patient instructions accurately reflect work and decisions made by me.  Correll Denbow, Channing Mutters, MD    Marieke Lubke, Channing Mutters, MD  11/25/14 1043

## 2014-11-24 NOTE — ED Notes (Signed)
[id number].

## 2014-11-24 NOTE — H&P (Signed)
Andrew [name] is an 59 y.o. male presents with left ureteral stone.    Past Medical History   Diagnosis Date   . Hypertension    . Diabetes mellitus    . High cholesterol    . UTI (urinary tract infection)    . Calculus of kidney        Allergies: No Known Allergies    Active Problems:    * No active hospital problems. *    Blood pressure 149/75, pulse 82, temperature 97.9 F (36.6 C), temperature source Oral, resp. rate 16, height 1.803 m (5\' 11" ), weight 92 kg (202 lb 13.2 oz), SpO2 98 %.    Review of Systems   All other systems reviewed and are negative.      Physical Exam   Constitutional: He is oriented to person, place, and time. He appears well-developed and well-nourished.   Cardiovascular: Normal rate and regular rhythm.    Pulmonary/Chest: Effort normal and breath sounds normal.   Abdominal: Soft.   Neurological: He is alert and oriented to person, place, and time.       Assessment:  Left ureteral stone.  Severe left back pain with nausea and vomiting.     Plan:  Informed consent obtained for cystoscopy, left ureteroscopy, laser lithotripsy, stent insertion, and retrograde pyelogram. Risks, benefits, and alternatives reviewed.  Answered all questions.  Patient was in ER and procedure being done on a emergent basis to being symptomatic.     Jobe Gibbon  11/24/2014

## 2014-11-24 NOTE — ED Provider Notes (Signed)
Physician/Midlevel provider first contact with patient: 11/24/14 0905                            [facility] EMERGENCY DEPARTMENT RESIDENT H&P       CLINICAL INFORMATION        HPI:      Chief Complaint: Flank Pain  .    Andrew [name] is a 59 y.o. male who presents with 8 out of 10 L lower back pain and shooting pain down his testicles and legs. Patient states he started having pain on 10/15/14 and was diagnosed by his urologist, Dr. Allena Katz, with a 6 cm lodged kidney stone on left side.    History obtained from: patient      ROS:      Review of Systems   Constitutional: Negative for fever and chills.   Respiratory: Negative for chest tightness and shortness of breath.    Gastrointestinal: Negative for nausea and vomiting.   Genitourinary: Positive for testicular pain.         Physical Exam:      Pulse (!) 103  BP    Resp 18  SpO2 100 %  Temp      Physical Exam   Constitutional: He is oriented to person, place, and time. He appears well-developed and well-nourished. He appears distressed.   HENT:   Head: Normocephalic and atraumatic.   Nose: Nose normal.   Eyes: Conjunctivae and EOM are normal. Pupils are equal, round, and reactive to light.   Neck: Normal range of motion. No JVD present. No tracheal deviation present.   Cardiovascular: Normal rate, regular rhythm and normal heart sounds.    Pulmonary/Chest: Effort normal and breath sounds normal. No stridor. No respiratory distress.   Abdominal: Soft. Bowel sounds are normal.   Musculoskeletal: Normal range of motion. He exhibits tenderness. He exhibits no edema.   Left lower back   Neurological: He is alert and oriented to person, place, and time. No cranial nerve deficit.   Skin: Skin is warm and dry. No rash noted. No erythema. No pallor.               PAST HISTORY        Primary Care Provider: Bertram Gala, MD        PMH/PSH:    .     Past Medical History   Diagnosis Date   . Hypertension    . Diabetes mellitus    . High cholesterol    . UTI (urinary  tract infection)    . Calculus of kidney        He has past surgical history that includes Hernia repair.      Social/Family History:      He reports that he has been smoking Pipe.  He does not have any smokeless tobacco history on file. He reports that he drinks alcohol. He reports that he does not use illicit drugs.    History reviewed. No pertinent family history.      Listed Medications on Arrival:    .     Home Medications                   amoxicillin-clavulanate (AUGMENTIN) 500-125 MG per tablet     Take 1 tablet by mouth 3 (three) times daily.     aspirin 81 MG chewable tablet     Chew 1 tablet (81 mg total) by mouth every  morning.     atorvastatin (LIPITOR) 80 MG tablet     Take 80 mg by mouth daily.     Exenatide ER 2 MG Pen-injector     Inject into the skin once a week.        glipiZIDE (GLUCOTROL) 10 MG 24 hr tablet     Take 10 mg by mouth daily.     lisinopril (PRINIVIL,ZESTRIL) 20 MG tablet     Take 20 mg by mouth daily.     metFORMIN (GLUCOPHAGE) 500 MG tablet     Take 500 mg by mouth 2 (two) times daily with meals.     Multiple Vitamin (MULTIVITAMIN) tablet     Take 1 tablet by mouth daily.     ondansetron (ZOFRAN ODT) 4 MG disintegrating tablet     Take 1 tablet (4 mg total) by mouth every 6 (six) hours as needed for Nausea.     oxyCODONE-acetaminophen (PERCOCET) 5-325 MG per tablet     1-2 tablets by mouth every 4-6 hours as needed for pain;  Do not drive or operate machinery while taking this medicine     tamsulosin (FLOMAX) 0.4 MG Cap     Take 1 capsule (0.4 mg total) by mouth daily. Discontinue for lightheadedness         Allergies: He has No Known Allergies.            VISIT INFORMATION        Reassessments/Clinical Course:            Conversations with Other Providers:      Dr. Allena Katz notified        Medications Given in the ED:    .     ED Medication Orders     Start Ordered     Status Ordering Provider    11/24/14 [phone] 11/24/14 0908  HYDROmorphone (DILAUDID) injection 0.5 mg   Once     Route:  Intravenous  Ordered Dose: 0.5 mg     Last MAR action:  Given Leroy Libman    11/24/14 0909 11/24/14 0908  ondansetron (ZOFRAN) injection 4 mg   Once     Route: Intravenous  Ordered Dose: 4 mg     Last MAR action:  Given Leroy Libman    11/24/14 9562 11/24/14 0906  sodium chloride 0.9 % bolus 1,000 mL   Once     Route: Intravenous  Ordered Dose: 1,000 mL     Last MAR action:  New Bag DRUCKENBROD, GLENN G            Procedures:      Procedures      Assessment/Plan:    Kidney stone 6 cm, left side    - Called Dr. Allena Katz.   - Patient NPO for kidney stone removal in OR               Nemiah Commander, [place]  Resident  11/24/14 0929    Nemiah Commander, DPM  Resident  11/24/14 1308    Nemiah Commander, DPM  Resident  11/24/14 [phone]

## 2014-11-24 NOTE — Anesthesia Postprocedure Evaluation (Signed)
Anesthesia Post Evaluation    Patient: Andrew [name]    Procedure(s):  CYSTOSCOPY, URETEROSCOPY, STONE MANIPULATION, LASER    Anesthesia type: general    Last Vitals:   Filed Vitals:    11/24/14 1647   BP: 137/77   Pulse: 99   Temp: 36.9 C (98.5 F)   Resp: 16   SpO2: 97%       Patient Location: Phase I PACU      Post Pain: Patient not complaining of pain, continue current therapy    Mental Status: awake and alert    Respiratory Function: tolerating room air    Cardiovascular: stable    Nausea/Vomiting: patient not complaining of nausea or vomiting    Hydration Status: adequate    Post Assessment: no apparent anesthetic complications, no evidence of recall and no reportable events          Anesthesia Qualified Clinical Data Registry    Central Line      CVC insertion : NO                                               Perioperative temperature management      General/neuraxial anesthesia > or = 60 minutes (excluding CABG) : YES              > Use of intraoperative active warming : YES              > Temperature > or = 36 degrees Centigrade (96.8 degrees Farenheit) during time span from 30 minutes before up to 15 minutes after anesthesia end time : YES      Administration of antibiotic prophylaxis      Age > or = 18, with IV access, with surgical procedure for which antibiotic prophylaxis indicated, and not on chronic antibiotics : YES              > Prophylactic antibiotics within 1 hour of incision (or fluroroquinolone/vancomycin within 2 hours of incision) : YES    Medication Administration      Ordering or administration of drug inconsistent with intended drug, dose, delivery or timing : NO      Dental loss/damage      Dental injury with administration of anesthesia : NO      Difficult intubation due to unrecognized difficult airway        Elective airway procedure including but not limited to: tracheostomy, fiberoptic bronchoscopy, rigid bronchoscopy; jet ventilation; or elective use of a device to facilitate  airway management such as a Glidescope : NO                > Unanticipated difficult intubation post pre-evaluation : NO      Aspiration of gastric contents        Aspiration of gastric contents : NO                    Surgical fire        Procedure requiring electrocautery/laser : NO                    Immediate perioperative cardiac arrest        Cardiac arrest in OR or PACU : NO                    Unplanned hospital admission  Unplanned hospital admission for initially intended outpatient anesthesia service : NO      Unplanned ICU admission        Unplanned ICU admission related to anesthesia occurring within 24 hours of induction or start of MAC : NO      Surgical case cancellation        Cancellation of procedure after care already started by anesthesia care team : NO      Post-anesthesia transfer of care checklist/protocol to PACU        Transfer from OR to PACU upon case conclusion : YES              > Use of PACU transfer checklist/protocol : YES     (Includes the key elements of: patient identification, responsible practitioner identification (PACU nurse or advanced practitioner), discussion of pertinent history and procedure course, intraoperative anesthetic management, post-procedure plans, acknowledgement/questions)    Post-anesthesia transfer of care checklist/protocol to ICU        Transfer from OR to ICU upon case conclusion : NO                    Post-operative nausea/vomiting risk protocol        Post-operative nausea/vomiting risk protocol : YES  Patient > or = 18 with care initiated by anesthesia team that has a risk factor screen for post-op nausea/vomiting (Includes male, hx PONV, or motion sickness, non-smoker, intended opioid administration for post-op analgesia.)    Anaphylaxis        Anaphylaxis during anesthesia services : NO    (Inclusive of any suspected transfusion reaction in association with blood-bank confirmed blood product incompatibility)              Florestine Avers,  11/24/2014 5:00 PM

## 2014-11-24 NOTE — Transfer of Care (Signed)
Anesthesia Transfer of Care Note    Patient: Andrew [name]    Procedures performed: Procedure(s):  CYSTOSCOPY, URETEROSCOPY, STONE MANIPULATION, LASER    Anesthesia type: General LMA    Patient location:Phase I PACU    Last vitals:   Filed Vitals:    11/24/14 1647   BP: 137/77   Pulse: 99   Temp: 36.9 C (98.5 F)   Resp: 16   SpO2: 97%       Post pain: Patient not complaining of pain, continue current therapy      Mental Status:awake and alert     Respiratory Function: tolerating nasal cannula    Cardiovascular: stable    Nausea/Vomiting: patient not complaining of nausea or vomiting    Hydration Status: adequate    Post assessment: no apparent anesthetic complications, no reportable events and no evidence of recall

## 2014-11-24 NOTE — Discharge Instructions (Signed)
Cystoscopy Discharge Instructions    GENERAL INFORMATION:   . Do not drive a car or operate machinery for 24 hours.  . Do not consume alcohol, tranquilizers, sleeping medications, or any non-prescribed medications for 24 hours unless approved by your physician.    . Do not make important decisions or sign any important papers in the next 24 hours.   . Please have a responsible person with you tonight.     ACTIVITY:   . Rest for 24 hours.  . No restrictions, resume your normal activity.     TREATMENT:  . You may have burning with urination. This is normal. To alleviate discomfort, drink plenty of fluids today and tomorrow.   . If it is difficult to start urinating, sit in a tub of warm water to urinate.   . The doctor has dilated your urethra. You will have a small amount of bleeding. It should stop within 24-28 hours.     MEDICATIONS:  . No aspirin or aspirin-containing products until advised by your physician.  . Use pain medication as directed by your physician.     DIET:   . Resume your normal diet.  . Begin with clear liquids, progress to normal diet.      NOTIFY PHYSICIAN IF:  . Persistent nausea or vomiting.  . Chills, fever (above 101 degrees F).   . Increased bleeding.  . Unable to urinate in 6-8 hours and abdomen is distended.                                   Ureteral Stent    A ureteral stent is a soft plastic tube with holes in it. It's temporarily inserted into a ureter to help drain urine into the bladder. One end goes in the kidney. The other end goes in the bladder. A coil on each end holds the stent in place. The stent can't be seen from outside the body. It shouldn't interfere with your normal routine. Your stent will be put in by a urologist (doctor trained in treating the urinary tract) or another specialist. The procedure is done in a hospital or surgery center. You'll likely go home the same day.  When Is a Ureteral Stent Used?  A ureteral stent may be used:   To bypass a blockage in a kidney  or ureter.   During kidney stone removal.   To let a ureter heal after surgery.    Before the Procedure  Your doctor will give you instructions toprepare for the procedure. X-rays or other imaging tests of your kidneys and ureters may be done beforehand.  During the Procedure   You receive medication to prevent pain and help you relax or sleep during the procedure. Once this takes effect, the procedure starts.   The doctor inserts a cystoscope(lighted instrument) through the urethra and into the bladder. This shows the opening to the ureter.   A thin wire is carefully threaded through the cystoscope, up the ureter, and into the kidney. The stent is inserted over the wire.   A fluoroscope (special x-ray machine) is used to help position the stent. When the stent is in place, the wire and cystoscope are removed.  While You Have a Stent   Some discomfort is normal. Certain movements may trigger pain or a feelingthat you need to urinate. You may also feel mild soreness or pressure before or during urination. These symptoms will  go away a few days after the stent is removed.   Medication to control pain or bladder spasms or to prevent infection may be prescribed. Take this as directed.   Drink plenty of fluids to help flush out your urinary tract.   Your urine may be slightly pink or red. This is due to bleeding caused by minor irritation from the stent. This may happenon and off while you have the stent.  How Long Will You Need a Stent?  The stent is often taken out after the blockagein the ureter is treated or the ureter has healed. This may take1-2 weeks, or longer. If a stent is needed for a long time, it may need to be changed every few months.  Call Your Doctor If:   Your urine contains blood clots.   You constantly leak urine.   You have a fever over 100.74F, chills, nausea, or vomiting.   Your pain is not relieved with medication.   The end of the stent comes out of the urethra.       [address], [address], [place], [place] [address]. All rights reserved. This information is not intended as a substitute for professional medical care. Always follow your healthcare professional's instructions.    GENERAL DISCHARGE INSTRUCTIONS    You may not drive or do anything requiring coordination or balance for 24 hours.  Rest for the rest of the day.  Avoid heavy lifting for 2 weeks after any surgery.    You may not drink alcohol or consume non-prescribed sedatives or tranquilizers for 24 hours unless approved by your physician.    You should not sign important papers or make important decisions in the next 24 hours.    Please have someone responsible with you the first night you are home.    Keep your dressing/wound site clean and dry.  Do not remove the dressing until advised by your physician.  Wash your hands frequently before and after touching your surgical site.    Begin your diet with clear liquids and progress to your normal diet as long as you are not nauseated. It is suggested that you avoid greasy or spicy foods.    It is suggested that unless specified by the pharmacist, you take all medications with food.  All narcotic type pain medications can cause constipation, keep fluid intake up and increase fiber in your diet.    Things to call your surgeon for:  Persistent Nausea and Vomiting  Chills or a Fever above 101 degrees F  Persistent bleeding, swelling or pus at the operative site  Unable to urinate in 8 hours  Pain that is not relieved by the pain medication.  Loss of feeling or inability to move fingers/toes on the surgical extremity  Blue color of nails or skin on the surgical extremity  Increased coldness of skin on the surgical extremity  Increased swelling of the surgical extremity, especially below the dressing/cast.  Increasing or severe pain, not relieved by your pain medication.    Jobe Gibbon, MD 11/24/2014       Discharge Instructions: After Your Surgery  You've  just had surgery. During surgery you were given medicine called anesthesia to keep you relaxed and free of pain. After surgery you may have some pain or nausea. This is common. Here are some tips for feeling better and getting well after surgery.    Going home  Your doctor or nurse will show you how to take care of  yourself when you go home. He or she will also answer your questions. Have an adult family member or friend drive you home. For the first 24 hours after your surgery:   Do not drive or use heavy equipment.   Do not make important decisions or sign legal papers.   Do not drink alcohol.   Have someone stay with you, if needed. He or she can watch for problems and help keep you safe.  Be sure to go to all follow-up visits with your doctor. And rest after your surgery for as long as your doctor tells you to.  Coping with pain  If you have pain after surgery, pain medicine will help you feel better. Take it as told, before pain becomes severe. Also, ask your doctor or pharmacist about other ways to control pain. This might be with heat, ice, or relaxation. And follow any other instructions your surgeon or nurse gives you.  Tips for taking pain medicine  To get the best relief possible, remember these points:   Pain medicines can upset your stomach. Taking them with a little food may help.   Most pain relievers taken by mouth need at least 20 to 30 minutes to start to work.   Taking medicine on a schedule can help you remember to take it. Try to time your medicine so that you can take it before starting an activity. This might be before you get dressed, go for a walk, or sit down for dinner.   Constipation is a common side effect of pain medicines. Call your doctor before taking any medicines such as laxatives or stool softeners to help ease constipation. Also ask if you should skip any foods. Drinkinglots of fluids andeating foodssuch as fruits and vegetables that are high in fiber can also help.  Remember, do not take laxatives unless your surgeon has prescribed them.   Drinking alcohol and taking pain medicine can cause dizziness and slow your breathing. It can even be deadly. Do not drink alcohol while taking pain medicine.   Pain medicine can make you react more slowly to things. Do not drive or run machinery while taking pain medicine.  Your health care providermay tell you to take acetaminophen to help ease your pain. Ask him or her how much you are supposed to take each day. Acetaminophen or other pain relievers may interact with your prescription medicines or other over-the-counter (OTC) drugs. Some prescription medicines have acetaminophen and other ingredients.Using both prescription and OTC acetaminophenfor paincan cause you to overdose. Readthe labels on your OTC medicineswith care. This will help youto clearly know the list of ingredients, how much to take, and anywarnings. It may also help you not take too muchacetaminophen.If you have questions or do not understand the information, ask your pharmacist or health care provider to explain it to you before you take the OTC medicine.  Managing nausea  Some people have an upset stomach after surgery. This is often because of anesthesia, pain, or pain medicine, or the stress of surgery. These tips will help you handle nausea and eat healthy foods as you get better. If you were on a special food plan before surgery, ask your doctor if you should follow it while you get better. These tips may help:   Do not push yourself to eat. Your body will tell you when to eat and how much.   Start off with clear liquids and soup. They are easier to digest.   Next try semi-solid foods,  such as mashed potatoes, applesauce, and gelatin, as you feel ready.   Slowly move to solid foods. Don't eat fatty, rich, or spicy foods at first.   Do not force yourself to have 3 large meals a day. Instead eat smaller amounts more often.   Take pain medicines with  a small amount of solid food, such as crackers or toast, to avoid nausea.         If you have obstructive sleep apnea  You were given anesthesia medicine during surgery to keep you comfortable and free of pain. After surgery, you may have more apnea spells because of this medicine and other medicines you were given. The spells may last longer than usual.  At home:   Keep using the continuous positive airway pressure (CPAP) device when you sleep. Unless your health care provider tells you not to, use it when you sleep, day or night. CPAP is a common device used to treat obstructive sleep apnea.   Talk with your provider before taking any pain medicine, muscle relaxants, or sedatives. Your provider will tell you about the possible dangers of taking these medicines.   [address] [employer] [address] [place], [place] [address]. All rights reserved. This information is not intended as a substitute for professional medical care. Always follow your healthcare professional's instructions.

## 2014-11-24 NOTE — Brief Op Note (Signed)
BRIEF OP NOTE    Date Time: 11/24/2014 4:36 PM    Patient Name:   Andrew [name]    Date of Operation:   11/24/2014    Providers Performing:   Surgeon(s):  Jobe Gibbon, MD    Assistant (s):   Circulator: [name], RN  Scrub Person: Ellender Hose    Operative Procedure:   Procedure(s):  CYSTOSCOPY, LEFT URETEROSCOPY, LASER LITHOTRIPSY, RETROGRADE PYELOGRAM, AND STENT INSERTION    Preoperative Diagnosis:   Pre-Op Diagnosis Codes:     * Left ureteral stone [N20.1]    Postoperative Diagnosis:   Post-Op Diagnosis Codes:     * Left ureteral stone [N20.1]    Anesthesia:   General    Estimated Blood Loss:    * No values recorded between 11/24/2014  4:11 PM and 11/24/2014  4:36 PM *    Implants:     Implant Name Type Inv. Item Serial No. Manufacturer Lot No. LRB No. Used Action   STENT PERCFLX [id number] - [id number] Stent STENT PERCFLX [id number]   [employer]     1 Implanted       Drains:       Specimens:        SPECIMENS (last 24 hours)      Pathology Specimens       11/24/14 1600             Specimen Information    Specimen Testing Required --       Specimen ID  --       Specimen Description --           Findings:   LEFT URETERAL STONE    Complications:   NONE      Signed by: Jobe Gibbon, MD                                                                           [facility] OR

## 2014-11-24 NOTE — PACU (Signed)
Pt. Ambulated and tolerated well, assisted up to chairPt. Voided without difficulty.Pt. Tolerated crackers and fluids well.

## 2015-10-09 ENCOUNTER — Emergency Department
Admission: EM | Admit: 2015-10-09 | Discharge: 2015-10-09 | Disposition: A | Discharge status: Home / Self Care | Attending: Emergency Medical Services | Admitting: Emergency Medical Services

## 2015-10-09 ENCOUNTER — Emergency Department

## 2015-10-09 ENCOUNTER — Emergency Department: Payer: Self-pay

## 2015-10-09 DIAGNOSIS — Z7984 Long term (current) use of oral hypoglycemic drugs: Secondary | ICD-10-CM | POA: Insufficient documentation

## 2015-10-09 DIAGNOSIS — Y990 Civilian activity done for income or pay: Secondary | ICD-10-CM | POA: Insufficient documentation

## 2015-10-09 DIAGNOSIS — S060X0A Concussion without loss of consciousness, initial encounter: Secondary | ICD-10-CM | POA: Insufficient documentation

## 2015-10-09 DIAGNOSIS — S40011A Contusion of right shoulder, initial encounter: Secondary | ICD-10-CM | POA: Insufficient documentation

## 2015-10-09 DIAGNOSIS — IMO0001 Reserved for inherently not codable concepts without codable children: Secondary | ICD-10-CM

## 2015-10-09 DIAGNOSIS — W208XXA Other cause of strike by thrown, projected or falling object, initial encounter: Secondary | ICD-10-CM | POA: Insufficient documentation

## 2015-10-09 DIAGNOSIS — E7800 Pure hypercholesterolemia, unspecified: Secondary | ICD-10-CM | POA: Insufficient documentation

## 2015-10-09 DIAGNOSIS — I10 Essential (primary) hypertension: Secondary | ICD-10-CM | POA: Insufficient documentation

## 2015-10-09 DIAGNOSIS — E119 Type 2 diabetes mellitus without complications: Secondary | ICD-10-CM | POA: Insufficient documentation

## 2015-10-09 DIAGNOSIS — S0990XA Unspecified injury of head, initial encounter: Secondary | ICD-10-CM

## 2015-10-09 MED ORDER — IBUPROFEN 600 MG PO TABS
600.0000 mg | ORAL_TABLET | Freq: Once | ORAL | Status: DC
Start: 2015-10-09 — End: 2015-10-09
  Filled 2015-10-09: qty 1

## 2015-10-09 MED ORDER — DIAZEPAM 5 MG PO TABS
5.0000 mg | ORAL_TABLET | Freq: Once | ORAL | Status: DC
Start: 2015-10-09 — End: 2015-10-09

## 2015-10-09 MED ORDER — ACETAMINOPHEN 500 MG PO TABS
1000.0000 mg | ORAL_TABLET | Freq: Once | ORAL | Status: AC
Start: 2015-10-09 — End: 2015-10-09
  Administered 2015-10-09: 1000 mg via ORAL
  Filled 2015-10-09: qty 2

## 2015-10-09 NOTE — ED Provider Notes (Signed)
EMERGENCY DEPARTMENT HISTORY AND PHYSICAL EXAM      Patient Information     Patient Name: Andrew [name], Andrew [name]  Encounter Date:  10/09/2015  Patient DOB:  12/10/1955  MRN:  [id number]  Room:  01B/A01B  Rendering Provider: Petra Kuba, PA-C    History of Presenting Illness     Chief Complaint: Headache, fall  Historian: Patient  Onset: Gradual  Quality: Mild  Location: Head, shoulder   Duration: 3 days      HPI Comments:   60 y.o. male with history of hypertension, diabetes presents to the ED 3 days following an injury at work in which a large piece of equipment fell back onto his shoulder knocking him to the ground where he had his head.  Since that time he has had persistent left shoulder pain and headache.  He had no loss of consciousness.  He has had no focal neurologic deficit, nausea, vomiting, memory loss.  No visual changes.  decreased range of motion of left shoulder due to pain.  Patient is not on anticoagulants.  He has no other complaints.      PMD: Bertram Gala, MD    Past Medical History     Past Medical History   Diagnosis Date   . Hypertension    . Diabetes mellitus    . High cholesterol    . UTI (urinary tract infection)    . Calculus of kidney        Past Surgical History     Past Surgical History   Procedure Laterality Date   . Hernia repair       umbilical   . Cystoscopy, ureteroscopy, stone manipulation, laser Left 11/24/2014     Procedure: CYSTOSCOPY, URETEROSCOPY, STONE MANIPULATION, LASER;  Surgeon: Jobe Gibbon, MD;  Location: [facility] OR;  Service: Urology;  Laterality: Left;       Family History     No family history on file.    Social History     Social History     Social History   . Marital Status: Married     Spouse Name: N/A   . Number of Children: N/A   . Years of Education: N/A     Social History Main Topics   . Smoking status: Former Smoker     Types: Pipe     Quit date: 08/13/2015   . Smokeless tobacco: Not on file   . Alcohol Use: Yes      Comment: 1 drink per month   . Drug Use:  No   . Sexual Activity: Not on file     Other Topics Concern   . Not on file     Social History Narrative       Allergies     No Known Allergies    Home Medications     Prior to Admission medications    Medication Sig Start Date End Date Taking? Authorizing Provider   glipiZIDE (GLUCOTROL) 10 MG 24 hr tablet Take 10 mg by mouth daily.   Yes [provider]   lisinopril (PRINIVIL,ZESTRIL) 20 MG tablet Take 40 mg by mouth daily.       Yes [provider]   metFORMIN (GLUCOPHAGE) 500 MG tablet Take 500 mg by mouth 2 (two) times daily with meals.   Yes [provider]   Multiple Vitamin (MULTIVITAMIN) tablet Take 1 tablet by mouth daily.   Yes [provider]   atorvastatin (LIPITOR) 80 MG tablet Take 80 mg by  mouth daily.    [provider]   Exenatide ER 2 MG Pen-injector Inject into the skin once a week.       [provider]   ondansetron (ZOFRAN ODT) 4 MG disintegrating tablet Take 1 tablet (4 mg total) by mouth every 6 (six) hours as needed for Nausea. 11/21/14   Deno Etienne, PA   oxybutynin XL (DITROPAN-XL) 5 MG 24 hr tablet Take 1 tablet (5 mg total) by mouth daily as needed for severe frequency and urgency to urinate 11/24/14   Jobe Gibbon, MD   oxyCODONE-acetaminophen (PERCOCET) 5-325 MG per tablet 1-2 tablets by mouth every 4-6 hours as needed for pain;  Do not drive or operate machinery while taking this medicine 11/21/14   Deno Etienne, PA   oxyCODONE-acetaminophen (PERCOCET) 5-325 MG per tablet Take 1 to 2 tablets by mouth every 4 to 6 hours as needed for pain 11/24/14   Jobe Gibbon, MD         Review of Systems     General: No fever, chills  Head: + history of trauma or headache.   Cardiac: No chest pain, palpitations, DOE  Respiratory: No SOB, wheezing, cough  GI: No nausea, vomiting, diarrhea. No abd pain.   Musculoskeletal: No muscle weakness. +shoulder pain  Neurologic: No numbness, tingling, weakness, gait disturbance.    Full 10 point ROS  negative other than noted in HPI.        Physical Exam     Patient Vitals for the past 24 hrs:   BP Temp Pulse Resp SpO2   10/09/15 0845 147/82 mmHg 98.2 F (36.8 C) 91 18 97 %     Constitutional:  Alert and oriented x4.  No acute distress.  Head:  Normocephalic. Atraumatic.  No skull depression or laceration  ENT:  PERRL. EOMI.  Mucous membranes are moist and intact.  Oropharynx is clear.  Neck: Supple.  Full ROM.  No cervical midline tenderness  Cardiovascular:  Regular rate.  Regular rhythm.   Pulmonary/Chest:No respiratory distress. Chest is symmetrical with respirations. Abdominal:  Soft, non-tender, and non-distended.  Rectal Exam: Deferred.  Musculoskeletal: Ecchymosis with tender to palpation over left anterior shoulder.  Full range of motion.  No cervical, thoracic, lumbar midline tenderness.  Vascular: Radial/DP/PT 2+ bilaterally.  Skin:Skin is warm and dry.   Neurological: Nonfocal. Gait steady.   Psychiatric: Normal interaction, affect, and behavior.       Orders Placed During This Encounter     Orders Placed This Encounter   Procedures   . CT Head WO Contrast   . Shoulder Left 2+ Views       ED Medications Administered     ED Medication Orders     Start Ordered     Status Ordering Provider    10/09/15 0932 10/09/15 0931  acetaminophen (TYLENOL) tablet 1,000 mg   Once     Route: Oral  Ordered Dose: 1,000 mg     Last MAR action:  Given Bingham Millette K    10/09/15 0859 10/09/15 0858     Once,   Status:  Discontinued     Route: Oral  Ordered Dose: 600 mg     Discontinued Demetric Dunnaway K    10/09/15 0859 10/09/15 0858     Once,   Status:  Discontinued     Route: Oral  Ordered Dose: 5 mg     Discontinued Bryannah Boston K          Diagnostic Study  Results and Data Review     The results of the diagnostic studies below were reviewed by the ED provider:    Labs  Results     ** No results found for the last 24 hours. **          Radiologic Studies  Radiology Results (24 Hour)     Procedure  Component Value Units Date/Time    Shoulder Left 2+ Views [[id number]] Collected:  10/09/15 0942    Order Status:  Completed Updated:  10/09/15 0952    Narrative:      History: Pain following blunt trauma.    Left shoulder series:  AP internal and external rotation and scapular Y views of the left  shoulder demonstrate no evidence of fracture or dislocation.  Small 3 mm  sclerotic focus in the central glenoid is suggestive of a bone island.  Hypertrophic changes are seen along the anterior inferior humeral head.  Joint spaces are normal.  No soft tissue calcification is evident.      Impression:       Probable benign 3 mm bone island in the glenoid.  Degenerative changes. No fracture.    Blair Promise, MD   10/09/2015 9:48 AM      CT Head WO Contrast [[id number]] Collected:  10/09/15 0920    Order Status:  Completed Updated:  10/09/15 0925    Narrative:      HISTORY: 60 years old,HEAD TRAUMA, CLOSED, MOD-SEVERE, headache         TECHNIQUE: Routine  head CT without contrast.  Dose reduction with automatic exposure control, iterative  reconstruction, and/or adjustment of the mA and/or kV according to  patient size.  COMPARISON: None.  FINDINGS:       There is no evidence of acute ischemia with unremarkable gray-white  junction.  There is no mass, mass effect, hemorrhage, or extra-axial fluid  collection.   The ventricles and the basal cisterns are unremarkable.   The paranasal sinuses are clear.             Impression:        Negative CT head.                   Einar Pheasant, MD   10/09/2015 9:21 AM              Abnormal results/incidental findings discussed with pt and/or family: yes    Monitors, EKG, Procedures, Critical Care, and Splints     na    MDM and Clinical Notes     Working Differential (not completely inclusive): Closed head injury, concussion, intercranial hemorrhage, left shoulder contusion, left shoulder fracture    Nursing records reviewed and agree: Yes    Clinical Notes: 60 year old diabetic male 3  days post work-related injury with ground-level fall suffering an ongoing headache and left shoulder pain.  Neurologically intact.  Clinical he mildly concussed.  However we will proceed with CT of the had an x-ray of the shoulder.  Treat pain and headache with Tylenol.    Re-Eval: Re-eval at patient's headache improved.  CT negative.  X-ray negative.  Advised follow up with concussion clinic patient voices understanding.  Recent signs and symptoms that would necessitate more emergent evaluation and patient voices understanding    Phone Conversation: n/a      Prescriptions       Discharge Medication List as of 10/09/2015 10:03 AM          Diagnosis and Disposition  Clinical Impression:  1. Closed head injury, initial encounter    2. Concussion, without loss of consciousness, initial encounter    3. Contusion shoulder/arm, right, initial encounter      Final diagnoses:   Closed head injury, initial encounter   Concussion, without loss of consciousness, initial encounter   Contusion shoulder/arm, right, initial encounter       Disposition:  ED Disposition     Discharge Pat Patrick Maudlin discharge to home/self care.    Condition at disposition: Stable                  Rendering Provider: Petra Kuba, PA-C    Attending's signature signifies review of the provider note and clinical impression.        Neill Loft, [place]  10/09/15 1645    Clovis Riley, MD  10/09/15 [phone]

## 2015-10-09 NOTE — ED Notes (Signed)
60 yo male with hx of HTN, DM, high cholesterol, UTI, and kidney stonepresents to ED with complaint of fall with associated head, lower back, and L shoulder injury while at work 4 days PTA. Pt denies LOC, denies n/v. Pt reports was working with a jack, which became loose and caused him to fall landing on shoulder and posterior aspect of head. Denies neck pain. Pt has full ROM to L shoulder. Alert male in triage, NAD.

## 2015-10-09 NOTE — Discharge Instructions (Signed)
Concussion     You have been diagnosed with a concussion.     A concussion is a type of injury to the head that causes a minor injury to the brain. Concussions can cause symptoms ranging from brief confusion to a true loss of consciousness (being knocked out). If a CT (CAT) scan were to be done, it would not show any serious injury such as any major bruising or bleeding in the brain.     Symptoms after a concussion can last from hours to months depending on how bad the injury was, and whether or not you had suffered from concussions in the past. Some of the problems they may have include difficulty with sleep, memory and concentration or attention (easy distractibility). Also, they may have chronic headaches and sensitivity to light. These symptoms can happen soon after the concussion or develop more slowly over time. They can last up to a year. When this happens, it is called "post concussive syndrome."     If you develop "post-concussive syndrome," you should follow up with your doctor. Your doctor can care for you or provide a referral to a head-injury specialist.     YOU SHOULD SEEK MEDICAL ATTENTION IMMEDIATELY, EITHER HERE OR AT THE NEAREST EMERGENCY DEPARTMENT, IF ANY OF THE FOLLOWING OCCURS:  · Your headache gets worse.  · Your headache pain changes.  · You have a fever (temperature higher than 100.4ºF / 38ºC).  · You feel numbness, tingling, weakness in your arms or legs.  · You faint.  · Your vision changes.  · You vomit often or cannot keep medication down.  · You are confused or have difficulty waking from sleep.

## 2015-10-09 NOTE — ED Notes (Signed)
Pt states pain started Tuesday, doesn't hurt at night, but begins during the morning

## 2015-10-19 ENCOUNTER — Ambulatory Visit (INDEPENDENT_AMBULATORY_CARE_PROVIDER_SITE_OTHER): Payer: Self-pay

## 2015-10-19 ENCOUNTER — Ambulatory Visit (INDEPENDENT_AMBULATORY_CARE_PROVIDER_SITE_OTHER): Payer: BLUE CROSS/BLUE SHIELD | Admitting: Clinical Neuropsychologist

## 2015-10-19 DIAGNOSIS — S060X0A Concussion without loss of consciousness, initial encounter: Secondary | ICD-10-CM

## 2015-10-19 NOTE — Progress Notes (Signed)
Procedures: This evaluation consisted of 1 unit of patient care including a medical record review, clinical interview, neuropsychological test administration/interpretation, report composition/review, patient face-to-face feedback, and recommendations.     Subjective:   Description of Injury:  Andrew [name] is a 60 y.o. male who presented to the clinic today for the initial evaluation of a potential head injury sustained on 10/07/2015. Reportedly, the patient was injured when an airplane jack fell over and landed on landed on [place] from which he was knocked to the ground and struck the back of his head on the ground. He denied a LOC and PTA. The patient reported no immediate symptoms. Andrew [name] reported an increase in symptoms 2 days later when he started using screens and computers. Andrew [name] denied dizziness or nausea following the accident. Andrew [name] has been evaluated by [name] for this injury, where the evaluation included a CT of his head, which was read as negative, and multiple x-rays of his shoulder, with no apparent fractures and recommendations included home concussion instructions. He was also evaluated by his PCP on 10/13/15, who referred him to the [facility] and recommended that he refrain from computer screens and TV. Since the initial injury date, he feels that any symptoms from concussion have improved. He was able to use the computer at work today, and noted an improvement in computer sensitivity. With regard to work,the patient has returned to a full day of work without the need for schedule modifications. Physically, he has a physical job and has continued his job responsibilities without change in pattern or symptom provocation and has returned to his usual PT (diabetes) routine without symptom provocation today.    Injury Details:   Loss of consciousness: No   Direct hit to head: Direct   Single Hit/ Double Hit: Single   Location of  Contact: Occipital   High velocity impact: Yes   Rotational Trauma: No   Any facial trauma: no   Headgear: NA   Amnesia- No   Confusion/ Disorientation- No   Immediate Symptoms- none   On-Field Dizzy:  No   Immediate removal from play:  No   Return to play same day:  NA   Return to work next available day: Yes   Club/ Team/ Organization: N/A   ImPACT Passport ID#: N/A    How did you hear about [facility]? [name]  Were you directly referred? Yes    Continued regular exercise from the time of injury until this evaluation? Yes  Level of physical activity:       5    Returned to school/work since the time of injury until this evaluation? Yes  Level of school/work involvement:      4    Current Symptoms:      Current physical symptoms include headaches (axial, slightly frontal; intermittent), visual difficulties (contrast of computer screens results in increased headache)  and noise sensitivity.     Current cognitive symptoms were denied.     Current emotional changes were denied.     Current sleep difficulties were denied.     Current nutrition and hydration habits are normal compared to his regular routine.    Physical/Social Activities:   Prior to the injury, the patient was involved in PT for diabetes. Since the injury, physical and/or recreational activities have included physical therapy (diabetes) and lifting weights. Social events have been limited, but the patients reports this is normal.  Biopsychosocial:   Personal history:   The patient reported a history of:  Concussion - No  Seizures-  No  Carsickness -Yes  Migraines - No  Headaches- No  Ocular Dysfunction - No  Glasses or Contacts - Yes  Anxiety - No  Depression -No    Family history:  The patient reported a family history of:  Carsickness - No  Headaches/Migraines -No  Ocular Dysfunction -No  Anxiety - No  Depression - No    Educational history:  The patient is currently employed as a Systems developer, with highest degree including a high school diploma and some college credits. Andrew [name] reported making B-C's in school.       History of ADHD - No  History of learning disability - No  History of being held back in school - Yes, 1st grade    Current Medications: The patient is not using OTC medications.    Vestibular/Ocular Motor Screening (VOMS) Assessment Results:  Andrew [name] was administered the VOMS assessment and results were discussed. Self-reported symptom severities (0-10) are reported below, with higher ratings reflecting greater symptom severity.    VOMS: Not Tested Headache Dizziness Nausea Fogginess Comments   Baseline Symptoms   1  0  0  0    Smooth Pursuits   1  0  0  0    Horizontal Saccades   1  0  0  0 Intrusions: no     Speed: Normal    Vertical Saccades   1  0  0  0 Intrusions: no     Speed: Normal   Convergence               1  0  0  0 Measure 1: 7 cm    Measure 2: 7 cm    Measure 3: 7 cm    Deviations: No     Horizontal VOR   1  0  0  0   Speed 180 bpm:Yes  Intrusions: no    Vertical VOR   1  0  0  0   Speed 180 bpm:Yes  Intrusions: no    Visual Motion Sensitivity   1  1  0  0   Speed 50 bpm: Yes  Intrusions: no   Noted blurry vision     Neuropsychological Test Results:  Andrew [name] was administered the Immediate Post-Concussion Assessment and Cognitive Testing (ImPACT) neuropsychological test and the Post-Concussion Symptom Scale (PCSS) on the computer. ImPACT raw scores and percentiles are reported below. The PCSS score ranges from 0-132 with higher scores reflecting report of greater symptom severity. Results were interpreted and discussed.    Domain Raw Score Percentile Comments   Verbal Memory Domain 53 2% Below Expectation;    Visual Memory Domain 51 12% Low Average;    Visual Motor Speed Domain 15.00 4% Below Expectation;    Reaction Time Domain 0.96 5% Below Expectation;    Impulse Control Domain 6     Pre-test Symptom Scale 8     Post-test Symptom Scale 3        Impression/ Plan:  Based on my evaluation today, it is my opinion that Andrew [name] sustained a cerebral concussion on 10/07/2015. Today, Andrew [name] presented with a mild symptom profile with likely ocular involvement in his ongoing symptomatology. As such, it was recommended that the patient implement a regulated daily schedule while progressing with physical/cognitive activities and brock string exercises. It is  suspected that lingering convergence difficulties may be resulting in ongoing headaches and computer sensitivities. With regard to work, the patient should remain in full time work with the use of rest breaks as needed (see letter). Today, Deondra demonstrated measurements outside of the expected range for near point convergence. He was provided a brock string and instructions for completing the exercises daily to help reduce his visual difficulties and reported computer sensitivities. In terms of physical activity, the patient will continue to complete his regular routine of physical activity, including walking and physical therapy exercises. Other recommendations discussed today included maintenance of a regulated schedule in terms of sleep, diet, hydration, light physical activity, and stress maintenance (handout provided to the patient). Additionally, the patient should follow the exposure-recovery model when resuming normal everyday activities by tolerating symptoms rated at a 2-4/10 severity and recovering from symptoms rated at a 5/10 severity or greater. I will plan to see the patient back in approximately 2 weeks at which time I will make any further recommendations as needed. I am hopeful he will be ready for clearance at that time.    Thank you for involving the [facility] in the care and evaluation of this patient.

## 2015-11-02 ENCOUNTER — Ambulatory Visit (INDEPENDENT_AMBULATORY_CARE_PROVIDER_SITE_OTHER)

## 2015-11-19 ENCOUNTER — Other Ambulatory Visit (INDEPENDENT_AMBULATORY_CARE_PROVIDER_SITE_OTHER): Payer: Self-pay | Admitting: Internal Medicine

## 2015-11-20 LAB — COMPREHENSIVE METABOLIC PANEL
ALT: 31 U/L (ref 10–50)
AST (SGOT): 22 U/L (ref 0–40)
African American eGFR: 141.03
Albumin: 5 g/dL (ref 3.5–5.2)
Alkaline Phosphatase: 52 U/L (ref 35–130)
BUN / Creatinine Ratio: 21.7
BUN: 15 mg/dL (ref 6–23)
Bilirubin, Total: 0.9 mg/dL (ref 0.0–1.2)
CO2: 22 mmol/L (ref 19–31)
Calcium: 9.6 mg/dL (ref 8.6–10.2)
Chloride: 99 mEq/l (ref 97–107)
Creatinine: 0.69 mg/dL — ABNORMAL LOW (ref 0.70–1.20)
Glucose: 113 mg/dL — ABNORMAL HIGH (ref 74–106)
Potassium: 4.4 mmol/L (ref 3.5–5.1)
Protein, Total: 7 g/dL (ref 6.30–8.70)
Sodium: 133 mmol/L — ABNORMAL LOW (ref 135–145)
non-African American eGFR: 116.56

## 2015-11-20 LAB — LIPID PANEL
Cholesterol / HDL Ratio: 2.8 (ref 0.0–3.6)
Cholesterol: 118 mg/dL (ref 0–200)
HDL: 42 mg/dL (ref >40)
LDL Calculated: 56 mg/dL (ref 0–129)
NON HDL CHOLESTEROL: 76 mg/dL
Triglycerides: 100 mg/dL (ref 0–150)

## 2015-11-20 LAB — HEMOGLOBIN A1C: Hemoglobin A1C: 7.1 g/dL — ABNORMAL HIGH (ref 3.8–5.7)

## 2015-11-20 LAB — MICROALBUMIN, RANDOM URINE
Microalb/Crt. Ratio: 88.4 mg/g — ABNORMAL HIGH (ref 0.0–30.0)
Microalbumin: 22.7 mg/L — ABNORMAL HIGH (ref 0.0–20.0)
Urine Creatinine: 26 mg/dl — ABNORMAL LOW (ref 39–259)

## 2015-11-25 ENCOUNTER — Encounter (INDEPENDENT_AMBULATORY_CARE_PROVIDER_SITE_OTHER): Payer: Self-pay

## 2016-03-15 ENCOUNTER — Ambulatory Visit: Payer: BLUE CROSS/BLUE SHIELD | Attending: Urology

## 2016-03-15 ENCOUNTER — Other Ambulatory Visit: Payer: Self-pay | Admitting: Urology

## 2016-03-15 DIAGNOSIS — Z87442 Personal history of urinary calculi: Secondary | ICD-10-CM

## 2017-05-23 ENCOUNTER — Encounter (HOSPITAL_COMMUNITY): Payer: Self-pay | Admitting: Emergency Medicine

## 2017-05-23 ENCOUNTER — Ambulatory Visit (HOSPITAL_COMMUNITY)
Admission: EM | Admit: 2017-05-23 | Discharge: 2017-05-23 | Disposition: A | Discharge status: Home / Self Care | Payer: Federal, State, Local not specified - PPO | Attending: Family Medicine | Admitting: Family Medicine

## 2017-05-23 DIAGNOSIS — H60501 Unspecified acute noninfective otitis externa, right ear: Secondary | ICD-10-CM

## 2017-05-23 HISTORY — DX: Type 2 diabetes mellitus without complications: E11.9

## 2017-05-23 HISTORY — DX: Essential (primary) hypertension: I10

## 2017-05-23 MED ORDER — MELOXICAM 7.5 MG PO TABS: 7.5000 mg | ORAL_TABLET | Freq: Every day | ORAL | 0 refills | Status: DC

## 2017-05-23 MED ORDER — NEOMYCIN-POLYMYXIN-HC 3.5-10000-1 OT SUSP: 4.0000 [drp] | Freq: Four times a day (QID) | OTIC | 0 refills | Status: AC

## 2017-05-23 MED ORDER — ACETAMINOPHEN 325 MG PO TABS
975.0000 mg | ORAL_TABLET | Freq: Once | ORAL | Status: AC
  Administered 2017-05-23: 975 mg via ORAL

## 2017-05-23 MED ORDER — ACETAMINOPHEN 325 MG PO TABS
ORAL_TABLET | ORAL | Status: AC
  Filled 2017-05-23: qty 3

## 2017-05-23 NOTE — ED Triage Notes (Signed)
PT got grease in right ear 3 days ago. PT reports pain and headaches today.

## 2017-05-23 NOTE — ED Provider Notes (Signed)
[facility]    CSN: [id number] Arrival date & time: 05/23/17  1935     History   Chief Complaint Chief Complaint  Patient presents with  . Otalgia    HPI Christopher Gay is a 62 y.o. male.   62 year old male comes in for 3-day history of right ear pain.  States he felt like he got something in it, has been using cotton swabs without relief, felt like he pushed something back into his ear.  States now having pain around the right side of his head and came in for evaluation.  Denies URI symptoms such as cough, congestion, sore throat.  Denies fever, chills, night sweats.  Denies recent travel/swimming.  Denies ear discharge.      Past Medical History:  Diagnosis Date  . Diabetes mellitus without complication (HCC)   . Hypertension     There are no active problems to display for this patient.   Past Surgical History:  Procedure Laterality Date  . HERNIA REPAIR         Home Medications    Prior to Admission medications   Medication Sig Start Date End Date Taking? Authorizing Provider  glipiZIDE (GLUCOTROL) 10 MG tablet Take 10 mg by mouth daily before breakfast.   Yes [provider]  metFORMIN (GLUCOPHAGE) 500 MG tablet Take 1,000 mg by mouth 2 (two) times daily with a meal.   Yes [provider]  meloxicam (MOBIC) 7.5 MG tablet Take 1 tablet (7.5 mg total) by mouth daily. 05/23/17   Cathie Hoops, Amy V, PA-C  neomycin-polymyxin-hydrocortisone (CORTISPORIN) 3.5-10000-1 OTIC suspension Place 4 drops into the right ear 4 (four) times daily for 7 days. 05/23/17 05/30/17  Belinda Fisher, PA-C    Family History No family history on file.  Social History Social History   Tobacco Use  . Smoking status: Not on file  Substance Use Topics  . Alcohol use: Not on file  . Drug use: Not on file     Allergies   Patient has no known allergies.   Review of Systems Review of Systems  Reason unable to perform ROS: See HPI as above.     Physical Exam Triage  Vital Signs ED Triage Vitals  Enc Vitals Group     BP 05/23/17 2022 (!) 154/89     Pulse Rate 05/23/17 2022 99     Resp 05/23/17 2022 16     Temp 05/23/17 2022 98.6 F (37 C)     Temp Source 05/23/17 2022 Oral     SpO2 05/23/17 2022 97 %     Weight 05/23/17 2021 210 lb (95.3 kg)     Height --      Head Circumference --      Peak Flow --      Pain Score 05/23/17 2021 4     Pain Loc --      Pain Edu? --      Excl. in GC? --    No data found.  Updated Vital Signs BP (!) 154/89   Pulse 99   Temp 98.6 F (37 C) (Oral)   Resp 16   Wt 210 lb (95.3 kg)   SpO2 97%   Visual Acuity Right Eye Distance:   Left Eye Distance:   Bilateral Distance:    Right Eye Near:   Left Eye Near:    Bilateral Near:     Physical Exam  Constitutional: He is oriented to person, place, and time. He appears well-developed and well-nourished.  No distress.  HENT:  Head: Normocephalic and atraumatic.  Right Ear: External ear normal.  Left Ear: External ear normal.  Right tragus tender to palpation.  Right ear canal with erythema, no obvious swelling.  Bilateral cerumen impaction.  TM not visible.  Patient unable to tolerate ear irrigation due to pain.  Eyes: Conjunctivae are normal. Pupils are equal, round, and reactive to light.  Neurological: He is alert and oriented to person, place, and time.     UC Treatments / Results  Labs (all labs ordered are listed, but only abnormal results are displayed) Labs Reviewed - No data to display  EKG  EKG Interpretation None       Radiology No results found.  Procedures Procedures (including critical care time)  Medications Ordered in UC Medications  acetaminophen (TYLENOL) tablet 975 mg (975 mg Oral Given 05/23/17 2111)     Initial Impression / Assessment and Plan / UC Course  I have reviewed the triage vital signs and the nursing notes.  Pertinent labs & imaging results that were available during my care of the patient were reviewed  by me and considered in my medical decision making (see chart for details).    Irrigation discontinued due to patient's intolerance.  We will treat for otitis externa for now.  Start Cortisporin as directed.  Patient can use over-the-counter Debrox and hydrogen peroxide to help with cerumen impaction post treatment.  Recheck as needed.    Patient new to the area, without PCP.  [facility] and wellness information provided.  PCP assistance.  Final Clinical Impressions(s) / UC Diagnoses   Final diagnoses:  Acute otitis externa of right ear, unspecified type    ED Discharge Orders        Ordered    neomycin-polymyxin-hydrocortisone (CORTISPORIN) 3.5-10000-1 OTIC suspension  4 times daily     05/23/17 2108    meloxicam (MOBIC) 7.5 MG tablet  Daily     05/23/17 2109        Belinda FisherYu, Amy V, PA-C 05/23/17 2258

## 2017-05-23 NOTE — Discharge Instructions (Signed)
Start cortisporin as directed. Once ear pain is slightly decreased, you can use over the counter debrox and hydrogen peroxide to help with the ear wax. You can take mobic to help with the headache. Follow up for reevaluation as needed.

## 2017-06-21 ENCOUNTER — Telehealth: Payer: Self-pay | Admitting: Medical

## 2017-06-21 ENCOUNTER — Ambulatory Visit: Payer: Federal, State, Local not specified - PPO | Admitting: Medical

## 2017-06-21 ENCOUNTER — Encounter: Payer: Self-pay | Admitting: Medical

## 2017-06-21 VITALS — BP 144/90 | HR 91 | Ht 70.4 in | Wt 202.0 lb

## 2017-06-21 DIAGNOSIS — Z7189 Other specified counseling: Secondary | ICD-10-CM | POA: Diagnosis not present

## 2017-06-21 DIAGNOSIS — E785 Hyperlipidemia, unspecified: Secondary | ICD-10-CM | POA: Diagnosis not present

## 2017-06-21 DIAGNOSIS — I10 Essential (primary) hypertension: Secondary | ICD-10-CM

## 2017-06-21 DIAGNOSIS — Z23 Encounter for immunization: Secondary | ICD-10-CM | POA: Insufficient documentation

## 2017-06-21 DIAGNOSIS — E118 Type 2 diabetes mellitus with unspecified complications: Secondary | ICD-10-CM

## 2017-06-21 DIAGNOSIS — H9203 Otalgia, bilateral: Secondary | ICD-10-CM

## 2017-06-21 DIAGNOSIS — Z7185 Encounter for immunization safety counseling: Secondary | ICD-10-CM | POA: Insufficient documentation

## 2017-06-21 DIAGNOSIS — E1159 Type 2 diabetes mellitus with other circulatory complications: Secondary | ICD-10-CM | POA: Insufficient documentation

## 2017-06-21 DIAGNOSIS — I152 Hypertension secondary to endocrine disorders: Secondary | ICD-10-CM | POA: Insufficient documentation

## 2017-06-21 LAB — POCT URINALYSIS DIP (PROADVANTAGE DEVICE)
Bilirubin, UA: NEGATIVE
Blood, UA: NEGATIVE
GLUCOSE UA: NEGATIVE mg/dL
Leukocytes, UA: NEGATIVE
Nitrite, UA: NEGATIVE
Specific Gravity, Urine: 1.01
UUROB: NEGATIVE
pH, UA: 6 (ref 5.0–8.0)

## 2017-06-21 MED ORDER — NEOMYCIN-POLYMYXIN-HC 3.5-10000-1 OT SOLN: 3.0000 [drp] | Freq: Four times a day (QID) | OTIC | 0 refills | Status: DC

## 2017-06-21 NOTE — Patient Instructions (Signed)
Encounter Diagnoses  Name Primary?  . Controlled type 2 diabetes mellitus with complication, without long-term current use of insulin (HCC) Yes  . Hypertension, unspecified type   . Hyperlipidemia, unspecified hyperlipidemia type   . Vaccine counseling   . Need for pneumococcal vaccination   . Otalgia of both ears     Recommendations:  Vaccines  Get a yearly flu shot in the fall  We recommend a Tetanus booster every 10 years  We updated your pneumococcal 23 vaccine today  Shingles vaccine:  I recommend you have a shingles vaccine to help prevent shingles or herpes zoster outbreak.   Please call your insurer to inquire about coverage for the Shingrix vaccine given in 2 doses.   Some insurers cover this vaccine after age 62, some cover this after age 62.  If your insurer covers this, then call to schedule appointment to have this vaccine here.  You will be due for Prevnar 13 vaccine at age 62 years old  Ear pain  Your ear canals seem swollen on right but likely improved form the emergency dept visit I would recommend using OTC ear wax drops or Hydrogen Peroxide 2 drops twice daily  for the next 2 weeks to help loosen the ear wax.  There is no sign of infection today.  However if the pain continues, begin antibiotic ear drop sent to pharmacy  Work on getting exercise most days of the week such as walking  Eat a health low fat diet.     We will call tomorrow with lab results and recommendations

## 2017-06-21 NOTE — Addendum Note (Signed)
Addended by: Darene LamerHOMPSON, Alga Southall T on: 06/21/2017 02:44 PM   Modules accepted: Orders

## 2017-06-21 NOTE — Telephone Encounter (Signed)
Pt wanted to make sure that Christopher Gay knows that he is out of Metformin so that he can get Metformin refill or some other equivalent asap lab results come in

## 2017-06-21 NOTE — Progress Notes (Signed)
Subjective; Chief Complaint  Patient presents with  . Medication Management    refills needed, he is fasting, check ear (right) x2-3weeks   Here as a new patient.  Moved from [place] end of January.  Needs refills.  Is out of Metformin.  Last year was on Lipitor.  Last labs for diabetes was last year.  Last HgbA1c was about 7%, glucose 131.  Has been out of metformin, moving, hasn't had time to exercise or take care of himself.  Retired from [employer] and the [employer].  Wife born here, Runner, broadcasting/film/video, so they moved back here.  All of his friends are in [place].  He thinks he has ear infection.   2 weeks ago started feeling pains in ear.    Checking sugars some.   Usually getting 140-150s.   Needs to get back on his exercise regimen.   Still getting use to Daniel.  Doesn't eat a lot of meats or biscuits.  No other aggravating or relieving factors. No other complaint.  Doesn't check BPs regularly, but hasn't had to be on medication.  Past Medical History:  Diagnosis Date  . Arthritis   . BPH without urinary obstruction   . Diabetes mellitus without complication (HCC) 2000   diagnosed in [place]  . Hyperlipidemia   . Hypertension     prior on medication year ago   Current Outpatient Medications on File Prior to Visit  Medication Sig Dispense Refill  . glipiZIDE (GLUCOTROL) 10 MG tablet Take 10 mg by mouth daily before breakfast.    . metFORMIN (GLUCOPHAGE) 500 MG tablet Take 1,000 mg by mouth 2 (two) times daily with a meal.    . meloxicam (MOBIC) 7.5 MG tablet Take 1 tablet (7.5 mg total) by mouth daily. (Patient not taking: Reported on 06/21/2017) 15 tablet 0   No current facility-administered medications on file prior to visit.    ROS as in subjective   Objective; BP (!) 144/90 (BP Location: Right Arm, Patient Position: Sitting, Cuff Size: Normal)   Pulse 91   Ht 5' 10.4" (1.788 m)   Wt 202 lb (91.6 kg)   SpO2 97%   BMI 28.66 kg/m   General appearance: alert, no distress,  WD/WN,  HEENT: normocephalic, sclerae anicteric, ear canals with moderate cerumen, but no erythema of TMs, nares patent, no discharge or erythema, pharynx normal Oral cavity: MMM, no lesions Neck: supple, no lymphadenopathy, no thyromegaly, no masses, no bruits Heart: RRR, normal S1, S2, no murmurs Lungs: CTA bilaterally, no wheezes, rhonchi, or rales Ext: no edema, mild varicose veins bilat Pulses: 2+ symmetric, upper and lower extremities, normal cap refill    Assessment: Encounter Diagnoses  Name Primary?  . Controlled type 2 diabetes mellitus with complication, without long-term current use of insulin (HCC) Yes  . Hypertension, unspecified type   . Hyperlipidemia, unspecified hyperlipidemia type   . Vaccine counseling   . Need for pneumococcal vaccination   . Otalgia of both ears      Plan: Reviewed chart history.   Labs today, confessed on diet, exercise, and diabetes care.    Counseled on the pneumococcal vaccine.  Vaccine information sheet given.  Pneumococcal vaccine PPSV23 given after consent obtained.  Recommendations:  Vaccines  Get a yearly flu shot in the fall  We recommend a Tetanus booster every 10 years  We updated your pneumococcal 23 vaccine today  Shingles vaccine:  I recommend you have a shingles vaccine to help prevent shingles or herpes zoster outbreak.   Please  call your insurer to inquire about coverage for the Shingrix vaccine given in 2 doses.   Some insurers cover this vaccine after age 62, some cover this after age 62.  If your insurer covers this, then call to schedule appointment to have this vaccine here.  You will be due for Prevnar 13 vaccine at age 62 years old  Ear pain  Your ear canals seem swollen on right but likely improved form the emergency dept visit I would recommend using OTC ear wax drops or Hydrogen Peroxide 2 drops twice daily  for the next 2 weeks to help loosen the ear wax.  There is no sign of infection today.  However if  the pain continues, begin antibiotic ear drop sent to pharmacy  Work on getting exercise most days of the week such as walking  Eat a health low fat diet.     We will call tomorrow with lab results and recommendations   [name] was seen today for medication management.  Diagnoses and all orders for this visit:  Controlled type 2 diabetes mellitus with complication, without long-term current use of insulin (HCC) -     Comprehensive metabolic panel -     Hemoglobin A1c -     Microalbumin / creatinine urine ratio -     HM DIABETES EYE EXAM  Hypertension, unspecified type -     Comprehensive metabolic panel -     CBC  Hyperlipidemia, unspecified hyperlipidemia type -     Comprehensive metabolic panel -     Lipid panel -     CBC  Vaccine counseling  Need for pneumococcal vaccination  Otalgia of both ears

## 2017-06-22 ENCOUNTER — Other Ambulatory Visit: Payer: Self-pay | Admitting: Medical

## 2017-06-22 LAB — CBC
HEMATOCRIT: 40.7 % (ref 37.5–51.0)
Hemoglobin: 14.3 g/dL (ref 13.0–17.7)
MCH: 30.9 pg (ref 26.6–33.0)
MCHC: 35.1 g/dL (ref 31.5–35.7)
MCV: 88 fL (ref 79–97)
Platelets: 244 10*3/uL (ref 150–379)
RBC: 4.63 x10E6/uL (ref 4.14–5.80)
RDW: 12.8 % (ref 12.3–15.4)
WBC: 6.7 10*3/uL (ref 3.4–10.8)

## 2017-06-22 LAB — COMPREHENSIVE METABOLIC PANEL
A/G RATIO: 1.7 (ref 1.2–2.2)
ALBUMIN: 4.7 g/dL (ref 3.6–4.8)
ALK PHOS: 88 IU/L (ref 39–117)
ALT: 51 IU/L — ABNORMAL HIGH (ref 0–44)
AST: 29 IU/L (ref 0–40)
BILIRUBIN TOTAL: 0.4 mg/dL (ref 0.0–1.2)
BUN / CREAT RATIO: 15 (ref 10–24)
BUN: 10 mg/dL (ref 8–27)
CHLORIDE: 97 mmol/L (ref 96–106)
CO2: 22 mmol/L (ref 20–29)
Calcium: 9.6 mg/dL (ref 8.6–10.2)
Creatinine, Ser: 0.65 mg/dL — ABNORMAL LOW (ref 0.76–1.27)
GFR calc Af Amer: 121 mL/min/{1.73_m2} (ref >59)
GFR calc non Af Amer: 105 mL/min/{1.73_m2} (ref >59)
GLOBULIN, TOTAL: 2.7 g/dL (ref 1.5–4.5)
Glucose: 263 mg/dL — ABNORMAL HIGH (ref 65–99)
Potassium: 4.3 mmol/L (ref 3.5–5.2)
SODIUM: 136 mmol/L (ref 134–144)
Total Protein: 7.4 g/dL (ref 6.0–8.5)

## 2017-06-22 LAB — LIPID PANEL
CHOLESTEROL TOTAL: 271 mg/dL — AB (ref 100–199)
Chol/HDL Ratio: 10 ratio — ABNORMAL HIGH (ref 0.0–5.0)
HDL: 27 mg/dL — ABNORMAL LOW (ref >39)
Triglycerides: 1019 mg/dL (ref 0–149)

## 2017-06-22 LAB — HEMOGLOBIN A1C
ESTIMATED AVERAGE GLUCOSE: 223 mg/dL
HEMOGLOBIN A1C: 9.4 % — AB (ref 4.8–5.6)

## 2017-06-22 LAB — MICROALBUMIN / CREATININE URINE RATIO
Creatinine, Urine: 23.2 mg/dL
MICROALB/CREAT RATIO: 478.9 mg/g{creat} — AB (ref 0.0–30.0)
MICROALBUM., U, RANDOM: 111.1 ug/mL

## 2017-06-22 MED ORDER — SITAGLIPTIN PHOS-METFORMIN HCL 50-1000 MG PO TABS: 1.0000 | ORAL_TABLET | Freq: Two times a day (BID) | ORAL | 1 refills | Status: DC

## 2017-06-22 MED ORDER — ROSUVASTATIN CALCIUM 10 MG PO TABS: 10.0000 mg | ORAL_TABLET | Freq: Every day | ORAL | 1 refills | Status: DC

## 2017-08-08 ENCOUNTER — Encounter: Payer: Self-pay | Admitting: Family Medicine

## 2017-08-08 ENCOUNTER — Ambulatory Visit: Payer: Federal, State, Local not specified - PPO | Admitting: Family Medicine

## 2017-08-08 VITALS — BP 140/86 | HR 82 | Temp 97.6°F | Ht 71.0 in | Wt 196.4 lb

## 2017-08-08 DIAGNOSIS — L237 Allergic contact dermatitis due to plants, except food: Secondary | ICD-10-CM | POA: Diagnosis not present

## 2017-08-08 MED ORDER — TRIAMCINOLONE ACETONIDE 0.1 % EX CREA: 1.0000 "application " | TOPICAL_CREAM | Freq: Two times a day (BID) | CUTANEOUS | 0 refills | Status: DC

## 2017-08-08 NOTE — Progress Notes (Signed)
   Subjective:    Patient ID: Christopher Gay, male    DOB: Jun 14, 1955, 62 y.o.   MRN: [id number]  HPI He complains of a rash present on the right shin after doing a bunch of yard work in his shorts.  He is developed some reddish linear lesions that itch.   Review of Systems     Objective:   Physical Exam Multiple erythematous vesicular/linear lesions are noted on the right anterior shin.  No other lesions noted on the leg.       Assessment & Plan:  Poison ivy dermatitis - Plan: triamcinolone cream (KENALOG) 0.1 % Recommend cool compresses, triamcinolone sparingly, Allegra or Claritin during the day and Benadryl at night.  Explained that it is better to treat it this way then with a steroid pill or shot.  He was comfortable with that.

## 2017-08-08 NOTE — Patient Instructions (Addendum)
Use cortisone cream during the day and cover it withPoison Ivy Dermatitis Poison ivy dermatitis is redness and soreness (inflammation) of the skin. It is caused by a chemical that is found on the leaves of the poison ivy plant. You may also have itching, a rash, and blisters. Symptoms often clear up in 1-2 weeks. You may get this condition by touching a poison ivy plant. You can also get it by touching something that has the chemical on it. This may include animals or objects that have come in contact with the plant. Follow these instructions at home: General instructions  Take or apply over-the-counter and prescription medicines only as told by your doctor.  If you touch poison ivy, wash your skin with soap and cold water right away.  Use hydrocortisone creams or calamine lotion as needed to help with itching.  Take oatmeal baths as needed. Use colloidal oatmeal. You can get this at a pharmacy or grocery store. Follow the instructions on the package.  Do not scratch or rub your skin.  While you have the rash, wash your clothes right after you wear them. Prevention  Know what poison ivy looks like so you can avoid it. This plant has three leaves with flowering branches on a single stem. The leaves are glossy. They have uneven edges that come to a point at the front.  If you have touched poison ivy, wash with soap and water right away. Be sure to wash under your fingernails.  When hiking or camping, wear long pants, a long-sleeved shirt, tall socks, and hiking boots. You can also use a lotion on your skin that helps to prevent contact with the chemical on the plant.  If you think that your clothes or outdoor gear came in contact with poison ivy, rinse them off with a garden hose before you bring them inside your house. Contact a doctor if:  You have open sores in the rash area.  You have more redness, swelling, or pain in the affected area.  You have redness that spreads beyond the  rash area.  You have fluid, blood, or pus coming from the affected area.  You have a fever.  You have a rash over a large area of your body.  You have a rash on your eyes, mouth, or genitals.  Your rash does not get better after a few days. Get help right away if:  Your face swells or your eyes swell shut.  You have trouble breathing.  You have trouble swallowing. This information is not intended to replace advice given to you by your health care provider. Make sure you discuss any questions you have with your health care provider. Document Released: 04/02/2010 Document Revised: 08/06/2015 Document Reviewed: 08/06/2014 Elsevier Interactive Patient Education  2018 [employer]. Use cortisone cream as well as cool compresses Use Benadryl at night and possibly Allegra or Claritin during the day for itching

## 2017-09-13 ENCOUNTER — Telehealth: Payer: Self-pay | Admitting: Medical

## 2017-09-13 ENCOUNTER — Ambulatory Visit: Payer: Federal, State, Local not specified - PPO | Admitting: Medical

## 2017-09-13 ENCOUNTER — Encounter: Payer: Self-pay | Admitting: Medical

## 2017-09-13 VITALS — BP 130/80 | HR 76 | Temp 97.7°F | Resp 16 | Ht 70.5 in | Wt 191.2 lb

## 2017-09-13 DIAGNOSIS — E118 Type 2 diabetes mellitus with unspecified complications: Secondary | ICD-10-CM | POA: Diagnosis not present

## 2017-09-13 DIAGNOSIS — K635 Polyp of colon: Secondary | ICD-10-CM | POA: Diagnosis not present

## 2017-09-13 DIAGNOSIS — R945 Abnormal results of liver function studies: Secondary | ICD-10-CM | POA: Diagnosis not present

## 2017-09-13 DIAGNOSIS — R7989 Other specified abnormal findings of blood chemistry: Secondary | ICD-10-CM | POA: Insufficient documentation

## 2017-09-13 DIAGNOSIS — E782 Mixed hyperlipidemia: Secondary | ICD-10-CM | POA: Insufficient documentation

## 2017-09-13 DIAGNOSIS — R809 Proteinuria, unspecified: Secondary | ICD-10-CM | POA: Insufficient documentation

## 2017-09-13 MED ORDER — LISINOPRIL 5 MG PO TABS: 5.0000 mg | ORAL_TABLET | Freq: Every day | ORAL | 3 refills | Status: DC

## 2017-09-13 NOTE — Telephone Encounter (Signed)
Pt called & stated he thought you were going to send in Lisinopril for his BP, but not sure.  He seemed to be confused.  Please call and let him know.

## 2017-09-13 NOTE — Telephone Encounter (Signed)
Yes, medication sent.   Other med refills pending lab results in case we need to change something

## 2017-09-13 NOTE — Addendum Note (Signed)
Addended by: Jac CanavanYSINGER, Cleto Claggett S on: 09/13/2017 01:51 PM   Modules accepted: Orders

## 2017-09-13 NOTE — Patient Instructions (Signed)
Recommendations We will call with lab results and recommendations We will refer you for colonoscopy Review the recommendations below   Type 2 Diabetes  Diabetes is a long-lasting (chronic) disease.  With diabetes, either the pancreas does not make enough of a hormone called insulin, or the body has trouble using the insulin that is made.  Over time, diabetes can damage the eyes, kidneys, and nerves causing retinopathy, nephropathy, and neuropathy.  Diabetes puts you at risk for heart disease and peripheral vascular disease which can lead to heart attack, stroke, foot ulcers, and amputations.    Our goal and hopefully your goal is to manage your diabetes in such a way to slow the progression of the disease and do all we can to keep you healthy  Home Care:   Eat healthy, exercise regularly, limit alcohol, and don't smoke!  Check your blood sugar (glucose) once a day before breakfast, or as indicated by our discussion today.  Take your medications daily, don't run out of medications.  Learn about low blood sugar (hypoglycemia). Know how to treat it.  Wear a necklace or bracelet that says you have diabetes.  Check your feet every night for cuts, sores, blisters, and redness. Tell your medical provider if you have problems.  Maintain a normal body weight, or normal BMI - height to weight ratio of 20-25.  Ask me about this.  GET HELP RIGHT AWAY IF:  You have trouble keeping your blood sugar in target range.  You have problems with your medicines.  You are sick and not getting better after 24 hours.  You have a sore or wound that is not healing.  You have vision problems or changes.  You have a fever.   Exercise regularly since it has beneficial effects on the heart and blood sugars. Exercising at least three times per week or 150 minutes per week can be as important as medication to a diabetic.  Find some form of exercise that you will enjoy doing regularly.  This can include  walking, biking, kayaking, golfing, swimming, dance, aerobics, hiking, etc.  If you have joint problems, many local gyms have equipment to accommodate people with specific needs.    Vaccinations:  Diabetics are at increased risk for infection, and illnesses can take longer to resolve.  Current vaccine recommendations include yearly Influenza (flu) vaccine (recommended in October), Pneumococcal vaccine, Hepatitis B vaccine series, Tdap (tetanus, diptheria and pertussis) vaccine every 10 years, and other age appropriate vaccinations.     Office visits:  We recommend routine medical care to make sure we are addressing prevention and issues as they arise.  Typically this could mean twice yearly or up to quarterly depending upon your unique health situation.  Exams should include a yearly physical, a yearly foot exam, and other examination as appropriate.  You should see an eye doctor yearly to help screen for and prevent blood vessel complications in your eyes.  Labs: Diabetics should have blood work done at least twice yearly to monitor your Hemoglobin A1C (a three-month average of your blood sugars) and your cholesterol.  You should have your urine and blood checked yearly to screen for kidney damage.  This may include creatinine and micro-albumin levels.  Other labs as appropriate.    Blood pressure goals:  Goal blood pressure in diabetics should be 130/80 or less. Monitoring your blood pressure with a home blood pressure cuff of your own is an excellent idea.  If you are prescribed medication for blood pressure,  take your medication every day, and don't run out of medication.  Having high blood pressure can damage your heart, eyes, kidneys, and put you at risk for heart attack and stroke.  Tobacco use:  If you smoke, dip or chew, quitting will reduce your risk of heart attack, stroke, peripheral vascular disease, and many cancers.    Diabetic Report Card for Elliot CousinScott Croy  September 13, 2017  Below is a  summary of recent tests related to your diabetes that can help you manage your health.   Hemoglobin A1C:  Your Hemoglobin A1C values should be less than 7. If these are greater than 7, you have a higher chance of having eye, heart, and kidney problems in the future.   Your most recent Hemoglobin A1C values were:  Hgb A1c MFr Bld (%)  Date Value  06/21/2017 9.4 (H)     Cholesterol:  Your LDL Cholesterol (bad cholesterol) values should be less than 100 mg/dL if you do not have cardiovascular disease.  The LDL should be less than 70 mg/dL if you do have cardiovascular disease.  If your LDL is consistently higher than 100 mg/dL, then your risk of heart attack and stroke increases yearly.   Your most recent labs showed high triglycerides and cholesterol.   Your HDL Cholesterol (good cholesterol) values should be higher than 40 mg/dL.  If your HDL is lower than 40 mg/dL, this increases your risk of heart attack and stroke.     Blood Pressure:  Your blood pressure values should be less than 130/80. Please contact me if your readings at home are consistently higher than this.   Your most recent blood pressure readings at our clinic were:  BP Readings from Last 3 Encounters:  09/13/17 130/80  08/08/17 140/86  06/21/17 (!) 144/90    Urine Protein: Having an elevated microalbumin to creatinine ratio is a marker for early kidney damage due to diabetes or high blood pressure.     Your most recent urine protein results did show elevated microablumin

## 2017-09-13 NOTE — Progress Notes (Addendum)
Subjective: Chief Complaint  Patient presents with  . diabetes    fasting diabetes follow up  needs refills   Here for f/u.    Last visit 08/08/17 he was a new patient.  Diabetes-last visit his A1c was over 9%, he was taking metformin and glipizide.  We changed to Janumet.  He is checking blood sugars, and getting 150-170s, sometimes 200s.  Seems to get higher numbers in the morning, worse in evening.  No polyuria, no polydipsia.   Not eating candy or fried foods.     Hyperlipidemia-last visit we added Crestor.  He was not taking any statin at that time.  Doesn't eat a lot of meat. Has hx/o renal stones, so limits meat intake.    His liver tests were elevated last visit.  He reports not drinking much.  Sometimes ultra light beer. Doesn't recal hx/o elevated liver tests, no prior exposure to hepatitis.  Has been screened for hepatitis prior he says, normal.   Couldn't exercise for about a month due to poison ivy, but otherwise has made lifestyle changes with improved diet and exercise since last visit.  Had hard time dealing with the poison ivy.  Has lost some weight intentionally.    Needs colonoscopy. Has hx/o colon polyp, PGM hx/o colon cancer.  Last colonoscopy 3 years ago  Has hx/o cardiac stress test a few years ago, was told it was fine, normal.     Past Medical History:  Diagnosis Date  . Arthritis   . BPH without urinary obstruction   . Diabetes mellitus without complication (HCC) 2000   diagnosed in [place]  . Hyperlipidemia   . Hypertension     prior on medication year ago   Current Outpatient Medications on File Prior to Visit  Medication Sig Dispense Refill  . meloxicam (MOBIC) 7.5 MG tablet Take 1 tablet (7.5 mg total) by mouth daily. 15 tablet 0  . rosuvastatin (CRESTOR) 10 MG tablet Take 1 tablet (10 mg total) by mouth at bedtime. 90 tablet 1  . sitaGLIPtin-metformin (JANUMET) 50-1000 MG tablet Take 1 tablet by mouth 2 (two) times daily with a meal. 180 tablet 1  .  triamcinolone cream (KENALOG) 0.1 % Apply 1 application topically 2 (two) times daily. 30 g 0   No current facility-administered medications on file prior to visit.    ROS as in subjective    Objective BP 130/80   Pulse 76   Temp 97.7 F (36.5 C) (Oral)   Resp 16   Ht 5' 10.5" (1.791 m)   Wt 191 lb 3.2 oz (86.7 kg)   SpO2 97%   BMI 27.05 kg/m   Wt Readings from Last 3 Encounters:  09/13/17 191 lb 3.2 oz (86.7 kg)  08/08/17 196 lb 6.4 oz (89.1 kg)  06/21/17 202 lb (91.6 kg)   BP Readings from Last 3 Encounters:  09/13/17 130/80  08/08/17 140/86  06/21/17 (!) 144/90    Gen: wd, wn, nad Neck: supple, no lymphadenopathy, no thyromegaly, no masses, no bruits Heart: RRR, normal S1, S2, no murmurs Lungs: CTA bilaterally, no wheezes, rhonchi, or rales Pulses: 2+ symmetric, upper and lower extremities, normal cap refill Ext: no edema Skin: healing scabbed linear lesions of lower legs from recent poison ivy dermatitis  Diabetic Foot Exam - Simple   Simple Foot Form Diabetic Foot exam was performed with the following findings:  Yes 09/13/2017  8:57 AM  Visual Inspection See comments:  Yes Sensation Testing Intact to touch and monofilament testing  bilaterally:  Yes Pulse Check Posterior Tibialis and Dorsalis pulse intact bilaterally:  Yes Comments Yellowish toenails throughout without obvious thickening or other deformity        Assessment: Encounter Diagnoses  Name Primary?  . Type 2 diabetes mellitus with complication, without long-term current use of insulin (HCC) Yes  . Mixed dyslipidemia   . Elevated LFTs   . Microalbuminuria   . Polyp of colon, unspecified part of colon, unspecified type      Plan: Diabetes-improving but still not at goal.  Pending labs will make some modifications to his regimen.  Discussed diabetes study as possibly being a candidate. Advised daily foot checks, yearly eye doctor visit, routine follow-up yearly, continue glucose  monitoring, counseled on diet and exercise  Mixed dyslipidemia-discussed results from last visit, continue Crestor, labs today  Elevated LFTs-not drinking much alcohol, repeat labs today and then will decide next steps  Referral for updated colonoscopy given family history of colon cancer in paternal grandmother and personal history of colon polyp  Microalbuminuria-begin back on Lisinopril.  He had been on this in years past.  [name] was seen today for diabetes.  Diagnoses and all orders for this visit:  Type 2 diabetes mellitus with complication, without long-term current use of insulin (HCC) -     Hemoglobin A1c -     Lipid panel -     Hepatic function panel -     HM DIABETES EYE EXAM -     HM DIABETES FOOT EXAM  Mixed dyslipidemia -     Hemoglobin A1c -     Lipid panel -     Hepatic function panel  Elevated LFTs -     Hepatic function panel  Microalbuminuria  Polyp of colon, unspecified part of colon, unspecified type -     Ambulatory referral to Gastroenterology  Other orders -     lisinopril (PRINIVIL,ZESTRIL) 5 MG tablet; Take 1 tablet (5 mg total) by mouth daily.

## 2017-09-14 LAB — LIPID PANEL
CHOL/HDL RATIO: 3.1 ratio (ref 0.0–5.0)
Cholesterol, Total: 129 mg/dL (ref 100–199)
HDL: 42 mg/dL (ref >39)
LDL CALC: 57 mg/dL (ref 0–99)
Triglycerides: 150 mg/dL — ABNORMAL HIGH (ref 0–149)
VLDL Cholesterol Cal: 30 mg/dL (ref 5–40)

## 2017-09-14 LAB — HEMOGLOBIN A1C
Est. average glucose Bld gHb Est-mCnc: 186 mg/dL
Hgb A1c MFr Bld: 8.1 % — ABNORMAL HIGH (ref 4.8–5.6)

## 2017-09-14 LAB — HEPATIC FUNCTION PANEL
ALBUMIN: 5 g/dL — AB (ref 3.6–4.8)
ALT: 19 IU/L (ref 0–44)
AST: 17 IU/L (ref 0–40)
Alkaline Phosphatase: 60 IU/L (ref 39–117)
Bilirubin Total: 0.7 mg/dL (ref 0.0–1.2)
Bilirubin, Direct: 0.19 mg/dL (ref 0.00–0.40)
TOTAL PROTEIN: 7 g/dL (ref 6.0–8.5)

## 2017-09-15 NOTE — Telephone Encounter (Signed)
Left message for pt

## 2017-09-18 ENCOUNTER — Other Ambulatory Visit: Payer: Self-pay | Admitting: Medical

## 2017-09-18 MED ORDER — ERTUGLIFLOZIN L-PYROGLUTAMICAC 5 MG PO TABS
1.0000 | ORAL_TABLET | Freq: Every day | ORAL | 2 refills | Status: DC
Start: 2017-09-18 — End: 2017-10-12

## 2017-09-18 MED ORDER — ROSUVASTATIN CALCIUM 10 MG PO TABS: 10.0000 mg | ORAL_TABLET | Freq: Every day | ORAL | 1 refills | Status: DC

## 2017-09-18 MED ORDER — SITAGLIPTIN PHOS-METFORMIN HCL 50-1000 MG PO TABS: 1.0000 | ORAL_TABLET | Freq: Two times a day (BID) | ORAL | 1 refills | Status: DC

## 2017-10-06 ENCOUNTER — Telehealth: Payer: Self-pay | Admitting: Medical

## 2017-10-06 NOTE — Telephone Encounter (Signed)
Pt states he can't take the Rosuvastatin said up all night with stomach cramps, wants something different called in

## 2017-10-08 ENCOUNTER — Telehealth: Payer: Self-pay | Admitting: Medical

## 2017-10-08 NOTE — Telephone Encounter (Signed)
P.A. STEGLATRO 

## 2017-10-09 ENCOUNTER — Other Ambulatory Visit: Payer: Self-pay | Admitting: Medical

## 2017-10-09 NOTE — Telephone Encounter (Signed)
How many days worth has he taken?  If only a few days, then I recommend he try taking 1/2 tablet every other day for a week and see if he tolerates this.   Then we can try and gradually increase the dose.  Most people do not have that much problem with this medication.  I recommend we gradually slowly start on this if having those issues.  Have him try 1/2 tablet every other day then call us back in 1-2 weeks to see if tolerating this

## 2017-10-10 NOTE — Telephone Encounter (Signed)
Left message for pt

## 2017-10-11 NOTE — Telephone Encounter (Signed)
Called pt back and states he figured out what was causing the issue was he was drinking too much coffee at night and that was irritating his stomach with the medicine, so everything is fine now and not having any issues.

## 2017-10-12 MED ORDER — DAPAGLIFLOZIN PROPANEDIOL 5 MG PO TABS: 5.0000 mg | ORAL_TABLET | Freq: Every day | ORAL | 3 refills | Status: DC

## 2017-10-12 NOTE — Telephone Encounter (Signed)
[employer] preferred meds are Tonny BollmanFarxiga, Xigduo XR, Jardiance, Synjardy/Synjardy XR and if pt can't be switched must call back to medical director t#[phone] for clinical discussion as to why pt can't be switched to preferred medication.   Can pt be switched?

## 2017-10-15 NOTE — Telephone Encounter (Signed)
done

## 2017-11-06 ENCOUNTER — Telehealth: Payer: Self-pay | Admitting: Medical

## 2017-11-06 NOTE — Telephone Encounter (Signed)
P.A. FARXIGA done and approved til 10/13/18

## 2017-11-14 ENCOUNTER — Ambulatory Visit: Payer: Federal, State, Local not specified - PPO | Admitting: Gastroenterology

## 2018-01-15 ENCOUNTER — Encounter: Payer: Federal, State, Local not specified - PPO | Admitting: Medical

## 2018-03-26 ENCOUNTER — Encounter: Payer: Self-pay | Admitting: Medical

## 2018-03-26 ENCOUNTER — Ambulatory Visit (INDEPENDENT_AMBULATORY_CARE_PROVIDER_SITE_OTHER): Payer: Federal, State, Local not specified - PPO | Admitting: Medical

## 2018-03-26 VITALS — BP 140/80 | HR 90 | Temp 98.2°F | Resp 16 | Ht 70.5 in | Wt 189.4 lb

## 2018-03-26 DIAGNOSIS — R109 Unspecified abdominal pain: Secondary | ICD-10-CM

## 2018-03-26 DIAGNOSIS — E785 Hyperlipidemia, unspecified: Secondary | ICD-10-CM

## 2018-03-26 DIAGNOSIS — T50905D Adverse effect of unspecified drugs, medicaments and biological substances, subsequent encounter: Secondary | ICD-10-CM

## 2018-03-26 DIAGNOSIS — I10 Essential (primary) hypertension: Secondary | ICD-10-CM

## 2018-03-26 DIAGNOSIS — E118 Type 2 diabetes mellitus with unspecified complications: Secondary | ICD-10-CM

## 2018-03-26 DIAGNOSIS — R809 Proteinuria, unspecified: Secondary | ICD-10-CM

## 2018-03-26 LAB — POCT URINALYSIS DIP (PROADVANTAGE DEVICE)
BILIRUBIN UA: NEGATIVE
BILIRUBIN UA: NEGATIVE mg/dL
Blood, UA: NEGATIVE
GLUCOSE UA: NEGATIVE mg/dL
Leukocytes, UA: NEGATIVE
Nitrite, UA: NEGATIVE
SPECIFIC GRAVITY, URINE: 1.015
Urobilinogen, Ur: NEGATIVE
pH, UA: 7 (ref 5.0–8.0)

## 2018-03-26 MED ORDER — GLIPIZIDE 10 MG PO TABS: 10.0000 mg | ORAL_TABLET | Freq: Every day | ORAL | 0 refills | Status: DC

## 2018-03-26 MED ORDER — METFORMIN HCL 500 MG PO TABS: 1000.0000 mg | ORAL_TABLET | Freq: Two times a day (BID) | ORAL | 0 refills | Status: DC

## 2018-03-26 NOTE — Progress Notes (Signed)
Subjective: Chief Complaint  Patient presents with  . med check    med check    Here for med check.  Diabetes - checking sugars, running 170s fasting.   Been taking Janumet and Farxiga, but for months been dealing with belly discomfort, cramping that he attributes to janumet.  He wants to go back on Metformin and Glipizide that he tolerated before last year when we switched to Janumet + Farxiga.    He also doesn't like the way the cholesterol makes him feel.  He stopped all of his medication this past week.    Marcelline Deist also seemed to give him headaches.    He has been on insulin in the past once daily, did ok on this.  Not exercising much.  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  Past Medical History:  Diagnosis Date  . Arthritis   . BPH without urinary obstruction   . Diabetes mellitus without complication (HCC) 2000   diagnosed in [place]  . Hyperlipidemia   . Hypertension     prior on medication year ago   Current Outpatient Medications on File Prior to Visit  Medication Sig Dispense Refill  . lisinopril (PRINIVIL,ZESTRIL) 5 MG tablet Take 1 tablet (5 mg total) by mouth daily. 90 tablet 3  . meloxicam (MOBIC) 7.5 MG tablet Take 1 tablet (7.5 mg total) by mouth daily. (Patient not taking: Reported on 03/26/2018) 15 tablet 0  . triamcinolone cream (KENALOG) 0.1 % Apply 1 application topically 2 (two) times daily. (Patient not taking: Reported on 03/26/2018) 30 g 0   No current facility-administered medications on file prior to visit.    ROS as in subjective   Objective: BP 140/80   Pulse 90   Temp 98.2 F (36.8 C) (Oral)   Resp 16   Ht 5' 10.5" (1.791 m)   Wt 189 lb 6.4 oz (85.9 kg)   SpO2 97%   BMI 26.79 kg/m   Wt Readings from Last 3 Encounters:  03/26/18 189 lb 6.4 oz (85.9 kg)  09/13/17 191 lb 3.2 oz (86.7 kg)  08/08/17 196 lb 6.4 oz (89.1 kg)   BP Readings from Last 3 Encounters:  03/26/18 140/80  09/13/17 130/80  08/08/17 140/86    General appearance: alert, no distress, WD/WN,  Oral cavity: MMM, no lesions Neck: supple, no lymphadenopathy, no thyromegaly, no masses Heart: RRR, normal S1, S2, no murmurs Lungs: CTA bilaterally, no wheezes, rhonchi, or rales Abdomen: +bs, soft, non tender, non distended, no masses, no hepatomegaly, no splenomegaly Pulses: 2+ symmetric, upper and lower extremities, normal cap refill     Assessment: Encounter Diagnoses  Name Primary?  . Hypertension, unspecified type Yes  . Hyperlipidemia, unspecified hyperlipidemia type   . Type 2 diabetes mellitus with complication, without long-term current use of insulin (HCC)   . Microalbuminuria   . Abdominal pain, unspecified abdominal location   . Adverse effect of drug, subsequent encounter      Plan: Labs as below Discussed his concerns, possible adverse effects.  We discussed the fact that when we changed from Metformin and Glipizide to Janumet and Marcelline Deist this past year, his HgbA1C had increased to 9% which is the reason for the med changes.   Nevertheless, he wants to only use metformin and glipizide for now.    Going forward, we will need to be slower on any changes and watch for adverse effects.  C/t glucose monitoring, needs to work on lifestyle changes  Taron was seen today  for med check.  Diagnoses and all orders for this visit:  Hypertension, unspecified type -     Comprehensive metabolic panel  Hyperlipidemia, unspecified hyperlipidemia type -     Comprehensive metabolic panel  Type 2 diabetes mellitus with complication, without long-term current use of insulin (HCC) -     Hemoglobin A1c -     HM DIABETES EYE EXAM -     HM DIABETES FOOT EXAM -     POCT Urinalysis DIP (Proadvantage Device)  Microalbuminuria -     Hemoglobin A1c -     HM DIABETES EYE EXAM -     HM DIABETES FOOT EXAM  Abdominal pain, unspecified abdominal location -     Comprehensive metabolic panel  Adverse effect of drug, subsequent  encounter  Other orders -     metFORMIN (GLUCOPHAGE) 500 MG tablet; Take 2 tablets (1,000 mg total) by mouth 2 (two) times daily with a meal. -     glipiZIDE (GLUCOTROL) 10 MG tablet; Take 1 tablet (10 mg total) by mouth daily before breakfast.

## 2018-03-27 LAB — COMPREHENSIVE METABOLIC PANEL
ALK PHOS: 68 IU/L (ref 39–117)
ALT: 27 IU/L (ref 0–44)
AST: 21 IU/L (ref 0–40)
Albumin/Globulin Ratio: 2.1 (ref 1.2–2.2)
Albumin: 4.8 g/dL (ref 3.6–4.8)
BUN/Creatinine Ratio: 17 (ref 10–24)
BUN: 12 mg/dL (ref 8–27)
Bilirubin Total: 0.4 mg/dL (ref 0.0–1.2)
CO2: 22 mmol/L (ref 20–29)
CREATININE: 0.7 mg/dL — AB (ref 0.76–1.27)
Calcium: 9.7 mg/dL (ref 8.6–10.2)
Chloride: 99 mmol/L (ref 96–106)
GFR calc Af Amer: 117 mL/min/{1.73_m2} (ref >59)
GFR calc non Af Amer: 101 mL/min/{1.73_m2} (ref >59)
GLOBULIN, TOTAL: 2.3 g/dL (ref 1.5–4.5)
Glucose: 268 mg/dL — ABNORMAL HIGH (ref 65–99)
POTASSIUM: 4.5 mmol/L (ref 3.5–5.2)
SODIUM: 139 mmol/L (ref 134–144)
Total Protein: 7.1 g/dL (ref 6.0–8.5)

## 2018-03-27 LAB — HEMOGLOBIN A1C
ESTIMATED AVERAGE GLUCOSE: 197 mg/dL
HEMOGLOBIN A1C: 8.5 % — AB (ref 4.8–5.6)

## 2018-04-04 ENCOUNTER — Other Ambulatory Visit (INDEPENDENT_AMBULATORY_CARE_PROVIDER_SITE_OTHER): Payer: Federal, State, Local not specified - PPO

## 2018-04-04 DIAGNOSIS — Z23 Encounter for immunization: Secondary | ICD-10-CM | POA: Diagnosis not present

## 2018-04-04 DIAGNOSIS — Z86010 Personal history of colonic polyps: Secondary | ICD-10-CM | POA: Diagnosis not present

## 2018-04-04 DIAGNOSIS — Z01818 Encounter for other preprocedural examination: Secondary | ICD-10-CM | POA: Diagnosis not present

## 2018-05-03 DIAGNOSIS — Q438 Other specified congenital malformations of intestine: Secondary | ICD-10-CM | POA: Diagnosis not present

## 2018-05-03 DIAGNOSIS — Z86010 Personal history of colonic polyps: Secondary | ICD-10-CM | POA: Diagnosis not present

## 2018-05-03 LAB — HM COLONOSCOPY

## 2018-06-06 ENCOUNTER — Other Ambulatory Visit (INDEPENDENT_AMBULATORY_CARE_PROVIDER_SITE_OTHER): Payer: Federal, State, Local not specified - PPO

## 2018-06-06 ENCOUNTER — Other Ambulatory Visit: Payer: Self-pay

## 2018-06-06 DIAGNOSIS — Z23 Encounter for immunization: Secondary | ICD-10-CM

## 2018-06-14 ENCOUNTER — Telehealth: Payer: Self-pay | Admitting: Medical

## 2018-06-14 ENCOUNTER — Other Ambulatory Visit: Payer: Self-pay | Admitting: Medical

## 2018-06-14 MED ORDER — GLIPIZIDE 10 MG PO TABS: 10.0000 mg | ORAL_TABLET | Freq: Every day | ORAL | 0 refills | Status: DC

## 2018-06-14 MED ORDER — METFORMIN HCL 500 MG PO TABS: 1000.0000 mg | ORAL_TABLET | Freq: Two times a day (BID) | ORAL | 0 refills | Status: DC

## 2018-06-14 NOTE — Telephone Encounter (Signed)
Is this ok to refill?  

## 2018-06-14 NOTE — Telephone Encounter (Signed)
Pt called for refills of metformin and glipizide. Pt does have an appt scheduled for May. Pt uses [facility]. Pt can be reached at [phone].

## 2018-07-04 ENCOUNTER — Ambulatory Visit: Payer: Federal, State, Local not specified - PPO | Admitting: Medical

## 2018-08-09 ENCOUNTER — Ambulatory Visit: Payer: Federal, State, Local not specified - PPO | Admitting: Medical

## 2018-08-31 ENCOUNTER — Other Ambulatory Visit: Payer: Self-pay | Admitting: Medical

## 2018-09-05 ENCOUNTER — Other Ambulatory Visit: Payer: Self-pay

## 2018-09-05 ENCOUNTER — Ambulatory Visit (INDEPENDENT_AMBULATORY_CARE_PROVIDER_SITE_OTHER): Payer: Federal, State, Local not specified - PPO | Admitting: Medical

## 2018-09-05 ENCOUNTER — Encounter: Payer: Self-pay | Admitting: Medical

## 2018-09-05 VITALS — BP 140/80 | HR 106 | Temp 98.4°F | Resp 16 | Ht 71.0 in | Wt 195.0 lb

## 2018-09-05 DIAGNOSIS — E118 Type 2 diabetes mellitus with unspecified complications: Secondary | ICD-10-CM

## 2018-09-05 DIAGNOSIS — I10 Essential (primary) hypertension: Secondary | ICD-10-CM | POA: Diagnosis not present

## 2018-09-05 DIAGNOSIS — E782 Mixed hyperlipidemia: Secondary | ICD-10-CM

## 2018-09-05 DIAGNOSIS — R809 Proteinuria, unspecified: Secondary | ICD-10-CM | POA: Diagnosis not present

## 2018-09-05 LAB — CBC WITH DIFFERENTIAL/PLATELET

## 2018-09-05 LAB — LIPID PANEL

## 2018-09-05 NOTE — Progress Notes (Signed)
Subjective Chief Complaint  Patient presents with  . DM    DM follow up sugar running good    Here for diabetes med check.  Diabetes-compliant with glipizide 10 mg 1 tablet daily, metformin 500 mg 2 tablets twice daily.  He just got a new glucometer.  Checking his blood sugars.  Usually runs in the 130-140 range.  He says this works well for him currently.  He does not feel well if he is running less than 120 or greater than 200 sugar range.  He also notes that he does not want any medication that has a lot of side effect or warnings.  Based on prior recommendations are prior history he does not plan to use [place] or other "chemicals."  He says he did not have time to deal with side effects or getting sick.  He is too busy for that.  Likewise he does not have time to deal with statins or side effects from statins.  He has not tolerated statins in the past.  He does not think he would be willing to take another statin.  He is compliant with lisinopril 5 mg daily.  He is exercising a lot, active and on the go all the time.  He feels good currently.  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  He does not check his blood pressure   Drinks a lot of coffee  Last stress test good, EKG not too long ago? Last colonoscopy good, not too long ago? However he cannot give me the dates of these but says they were several years ago   No other new complaint   Past Medical History:  Diagnosis Date  . Arthritis   . BPH without urinary obstruction   . Diabetes mellitus without complication ([place]) 5188   diagnosed in [place]  . Hyperlipidemia   . Hypertension     prior on medication year ago   Current Outpatient Medications on File Prior to Visit  Medication Sig Dispense Refill  . glipiZIDE (GLUCOTROL) 10 MG tablet TAKE 1 TABLET (10 MG TOTAL) BY MOUTH DAILY BEFORE BREAKFAST. 90 tablet 0  . lisinopril  (ZESTRIL) 5 MG tablet TAKE 1 TABLET BY MOUTH EVERY DAY 90 tablet 0  . metFORMIN (GLUCOPHAGE) 500 MG tablet TAKE 2 TABLETS (1,000 MG TOTAL) BY MOUTH 2 (TWO) TIMES DAILY WITH A MEAL. 180 tablet 0   No current facility-administered medications on file prior to visit.    ROS as in subjective    Objective: BP 140/80   Pulse (!) 106   Temp 98.4 F (36.9 C) (Oral)   Resp 16   Ht 5\' 11"  (1.803 m)   Wt 195 lb (88.5 kg)   SpO2 97%   BMI 27.20 kg/m   Wt Readings from Last 3 Encounters:  09/05/18 195 lb (88.5 kg)  03/26/18 189 lb 6.4 oz (85.9 kg)  09/13/17 191 lb 3.2 oz (86.7 kg)   BP Readings from Last 3 Encounters:  09/05/18 140/80  03/26/18 140/80  09/13/17 130/80    General appearence: alert, no distress, WD/WN,  Neck: supple, no lymphadenopathy, no thyromegaly, no masses Heart: RRR, normal S1, S2, no murmurs Lungs: CTA bilaterally, no wheezes, rhonchi, or rales Abdomen: +bs, soft, non tender, non distended, no masses, no hepatomegaly, no splenomegaly Pulses: 2+ symmetric, upper and lower extremities, normal cap refill  Diabetic Foot Exam - Simple   Simple Foot Form Diabetic  Foot exam was performed with the following findings: Yes 09/05/2018  1:49 PM  Visual Inspection See comments: Yes Sensation Testing Intact to touch and monofilament testing bilaterally: Yes Pulse Check Posterior Tibialis and Dorsalis pulse intact bilaterally: Yes Comments Somewhat thickened toenails throughout particularly the great toenails, somewhat yellowish toenails throughout likely onychomycosis     Assessment: Encounter Diagnoses  Name Primary?  . Type 2 diabetes mellitus with complication, without long-term current use of insulin (HCC) Yes  . Hypertension, unspecified type   . Mixed dyslipidemia   . Microalbuminuria     Plan: He discussed that he would potentially be willing to do an updated cardiac stress test this fall.  I do not have a lot of records from where he moved down from  [place] this past year  He is active and exercising.  He seems to be eating reasonably healthy.  He is compliant with his medications currently.  His blood pressures not quite where we want it.   Unfortunately he does not seem to be too keen on changing his medications very much  Routine labs as below today.  Continue glucose monitoring, daily foot checks.  See eye doctor and dentist yearly.   [name] was seen today for dm.  Diagnoses and all orders for this visit:  Type 2 diabetes mellitus with complication, without long-term current use of insulin (HCC) -     Hemoglobin A1c -     Microalbumin/Creatinine Ratio, Urine -     CBC with Differential/Platelet -     Basic metabolic panel  Hypertension, unspecified type  Mixed dyslipidemia -     Lipid panel  Microalbuminuria -     Microalbumin/Creatinine Ratio, Urine -     CBC with Differential/Platelet

## 2018-09-06 LAB — CBC WITH DIFFERENTIAL/PLATELET
Basophils Absolute: 0 10*3/uL (ref 0.0–0.2)
Basos: 1 %
EOS (ABSOLUTE): 0.1 10*3/uL (ref 0.0–0.4)
Eos: 2 %
Hematocrit: 37.3 % — ABNORMAL LOW (ref 37.5–51.0)
Hemoglobin: 13.6 g/dL (ref 13.0–17.7)
Immature Grans (Abs): 0 10*3/uL (ref 0.0–0.1)
Immature Granulocytes: 0 %
Lymphocytes Absolute: 1.8 10*3/uL (ref 0.7–3.1)
Lymphs: 31 %
MCH: 31.7 pg (ref 26.6–33.0)
MCHC: 36.5 g/dL — ABNORMAL HIGH (ref 31.5–35.7)
MCV: 87 fL (ref 79–97)
Monocytes Absolute: 0.5 10*3/uL (ref 0.1–0.9)
Monocytes: 8 %
Neutrophils Absolute: 3.4 10*3/uL (ref 1.4–7.0)
Neutrophils: 58 %
Platelets: 265 10*3/uL (ref 150–450)
RBC: 4.29 x10E6/uL (ref 4.14–5.80)
RDW: 13.1 % (ref 11.6–15.4)
WBC: 5.9 10*3/uL (ref 3.4–10.8)

## 2018-09-06 LAB — BASIC METABOLIC PANEL
BUN/Creatinine Ratio: 26 — ABNORMAL HIGH (ref 10–24)
BUN: 20 mg/dL (ref 8–27)
CO2: 21 mmol/L (ref 20–29)
Calcium: 10.1 mg/dL (ref 8.6–10.2)
Chloride: 98 mmol/L (ref 96–106)
Creatinine, Ser: 0.78 mg/dL (ref 0.76–1.27)
GFR calc Af Amer: 112 mL/min/{1.73_m2} (ref >59)
GFR calc non Af Amer: 97 mL/min/{1.73_m2} (ref >59)
Glucose: 256 mg/dL — ABNORMAL HIGH (ref 65–99)
Potassium: 4.6 mmol/L (ref 3.5–5.2)
Sodium: 135 mmol/L (ref 134–144)

## 2018-09-06 LAB — LIPID PANEL
Chol/HDL Ratio: 6.2 ratio — ABNORMAL HIGH (ref 0.0–5.0)
Cholesterol, Total: 235 mg/dL — ABNORMAL HIGH (ref 100–199)
HDL: 38 mg/dL — ABNORMAL LOW (ref >39)
Triglycerides: 464 mg/dL — ABNORMAL HIGH (ref 0–149)

## 2018-09-06 LAB — HEMOGLOBIN A1C
Est. average glucose Bld gHb Est-mCnc: 212 mg/dL
Hgb A1c MFr Bld: 9 % — ABNORMAL HIGH (ref 4.8–5.6)

## 2018-09-06 LAB — MICROALBUMIN / CREATININE URINE RATIO
Creatinine, Urine: 126.9 mg/dL
Microalb/Creat Ratio: 180 mg/g creat — ABNORMAL HIGH (ref 0–29)
Microalbumin, Urine: 227.8 ug/mL

## 2018-09-11 ENCOUNTER — Encounter: Payer: Self-pay | Admitting: Medical

## 2018-09-17 ENCOUNTER — Telehealth: Payer: Self-pay | Admitting: Internal Medicine

## 2018-09-17 NOTE — Telephone Encounter (Signed)
Pt called back & states he is concerned over the new medication and has some questions and would like to talk to you about it first, please call him back so he can discuss it with you first

## 2018-09-17 NOTE — Telephone Encounter (Signed)
Pt called back & states insurance will cover the Rybelsus but needs that sent to mail order in order for him to afford it because it is more expensive, please send this medication to [facility]

## 2018-09-17 NOTE — Telephone Encounter (Signed)
Pt got the letter of his results. Pt needs meds sent in.  Metformin- higher dose Lisinopril - higher dose crestor  He is still going to check with his insurance about Rybelsus

## 2018-09-18 ENCOUNTER — Other Ambulatory Visit: Payer: Self-pay | Admitting: Medical

## 2018-09-20 ENCOUNTER — Ambulatory Visit: Payer: Federal, State, Local not specified - PPO | Admitting: Medical

## 2018-09-20 ENCOUNTER — Encounter: Payer: Self-pay | Admitting: Medical

## 2018-09-20 ENCOUNTER — Telehealth: Payer: Self-pay

## 2018-09-20 ENCOUNTER — Other Ambulatory Visit: Payer: Self-pay

## 2018-09-20 VITALS — BP 140/86 | HR 80 | Temp 98.2°F | Resp 16 | Wt 191.8 lb

## 2018-09-20 DIAGNOSIS — E118 Type 2 diabetes mellitus with unspecified complications: Secondary | ICD-10-CM | POA: Diagnosis not present

## 2018-09-20 DIAGNOSIS — R809 Proteinuria, unspecified: Secondary | ICD-10-CM

## 2018-09-20 DIAGNOSIS — I10 Essential (primary) hypertension: Secondary | ICD-10-CM | POA: Diagnosis not present

## 2018-09-20 DIAGNOSIS — E782 Mixed hyperlipidemia: Secondary | ICD-10-CM | POA: Diagnosis not present

## 2018-09-20 DIAGNOSIS — N419 Inflammatory disease of prostate, unspecified: Secondary | ICD-10-CM | POA: Diagnosis not present

## 2018-09-20 DIAGNOSIS — N3000 Acute cystitis without hematuria: Secondary | ICD-10-CM

## 2018-09-20 LAB — POCT URINALYSIS DIP (PROADVANTAGE DEVICE)
Bilirubin, UA: NEGATIVE
Blood, UA: NEGATIVE
Glucose, UA: NEGATIVE mg/dL
Nitrite, UA: NEGATIVE
Protein Ur, POC: 30 mg/dL — AB
Specific Gravity, Urine: 1.02
Urobilinogen, Ur: NEGATIVE
pH, UA: 6 (ref 5.0–8.0)

## 2018-09-20 MED ORDER — LISINOPRIL 10 MG PO TABS: 10.0000 mg | ORAL_TABLET | Freq: Every day | ORAL | 3 refills | Status: DC

## 2018-09-20 MED ORDER — RYBELSUS 3 MG PO TABS: 1.0000 | ORAL_TABLET | Freq: Every day | ORAL | 0 refills | Status: DC

## 2018-09-20 MED ORDER — ROSUVASTATIN CALCIUM 20 MG PO TABS: 20.0000 mg | ORAL_TABLET | Freq: Every day | ORAL | 3 refills | Status: DC

## 2018-09-20 MED ORDER — METFORMIN HCL 850 MG PO TABS: 850.0000 mg | ORAL_TABLET | Freq: Two times a day (BID) | ORAL | 0 refills | Status: DC

## 2018-09-20 MED ORDER — DOXYCYCLINE HYCLATE 100 MG PO TABS: 100.0000 mg | ORAL_TABLET | Freq: Two times a day (BID) | ORAL | 0 refills | Status: DC

## 2018-09-20 NOTE — Patient Instructions (Addendum)
Your symptoms today suggest prostate infection  Begin doxycycline antibiotic 1 tablet twice a day  Use this for at least the next 10 days but you may need to go for 20 days on this antibiotic  Stay out of the direct sunlight for prolonged periods as this could cause a sun rash reaction  Rest and drink plenty of fluids  If you get worse such as fever over 101, nausea and vomiting, fast heart rate, blood pressure dropping significantly below your typical blood pressure doing go to the hospital as this could be signs of worsening infection  If you are not seeing improvement significantly by mid next week then call back   We discussed the med changes from your recent letter I sent you I sent medications to mail order pharmacy [facility] today  Here are the new medication changes  Begin metformin 850 mg twice daily instead of the 500 mg twice daily  Increase to Lisinopril 10 mg daily instead of 5 mg daily  Begin Crestor 20 mg medication for cholesterol.  You can start 1/2 tablet either daily or 3 days a week if you feel more comfortable with this.  We can ease into this medication since you are a little nervous about this.  Take this at bedtime.  Often people take Crestor along with baby aspirin 81 mg at nighttime for heart health and to help with any concerns with medication side effects such as muscle aches   For diabetes also want you to start a new medication called Rybelsus.  However I do not want you to take this until you feel much better in regards to the prostate infection.  So let us wait about a week until you begin this medication assuming you feel better.  When you do feel better you will begin 3 mg pill once daily for the next month.  After he had taken this about 2 weeks call back and let me know if you are tolerating this.  I am not going to send this to the mail order pharmacy until I know you are tolerating this.  That way we do not have you run this through insurance yet  until we know you are tolerating it.   Discontinue glipizide   Exercise: I recommend exercising most days of the week using a type of exercise that you would enjoy and stick to such as walking, hiking, biking, aerobics, etc.   Low Carb Diet recommendations:  I recommend you drink water throughout the day.   70 ounces or 2 liters would be a good amount.  If you have been accustomed to drinking juice or soda, try water with lemon or water with lime, or try using no calorie flavor dropper.  I recommend the following as an example meal plan that includes 3 meals per day.   You can skip some meals periodically for intermittent fasting.  Breakfast (choose one): Marland Kitchen. Omelette with egg, can include a little bit of cheese, your choice of mushrooms, peppers, onions, salsa . Smoothie with handful of spinach or kale, 1 cup of milk or water, 1 cup of berries, 1 packet of artificial sweetener such as stevia . Yogurt with fruit   Mid morning snack: . 1 fruit serving and 1 protein such as 8 almonds or 8 nuts, or vegetable such as carrots and humus or other similar vegetable   Lunch: . Salad with 3-4 ounces of lean grilled or baked meat such as fish, chicken or [place]   Mid  afternoon snack: . 1 fruit serving and 1 protein such as 8 almonds or 8 nuts, or vegetable such as carrots and humus or other similar vegetable   Dinner: . Large serving of vegetables and 3-4 ounces of lean grilled or baked meat such as fish, chicken or [place] . Or vegetarian dish without meat    Avoid  . Chips, cookies, cake, donuts, soda, sweet tea, juices, candy, fast food . For now , avoid, or significantly limit grains     Prostatitis  Prostatitis is swelling or inflammation of the prostate gland. The prostate is a walnut-sized gland that is involved in the production of semen. It is located below a man's bladder, in front of the rectum. There are four types of prostatitis:  Chronic nonbacterial prostatitis.  This is the most common type of prostatitis. It may be associated with a viral infection or autoimmune disorder.  Acute bacterial prostatitis. This is the least common type of prostatitis. It starts quickly and is usually associated with a bladder infection, high fever, and shaking chills. It can occur at any age.  Chronic bacterial prostatitis. This type usually results from acute bacterial prostatitis that happens repeatedly (is recurrent) or has not been treated properly. It can occur in men of any age but is most common among middle-aged men whose prostate has begun to get larger. The symptoms are not as severe as symptoms caused by acute bacterial prostatitis.  Prostatodynia or chronic pelvic pain syndrome (CPPS). This type is also called pelvic floor disorder. It is associated with increased muscular tone in the pelvis surrounding the prostate. What are the causes? Bacterial prostatitis is caused by infection from bacteria. Chronic nonbacterial prostatitis may be caused by:  Urinary tract infections (UTIs).  Nerve damage.  A response by the body's disease-fighting system (autoimmune response).  Chemicals in the urine. The causes of the other types of prostatitis are usually not known. What are the signs or symptoms? Symptoms of this condition vary depending upon the type of prostatitis. If you have acute bacterial prostatitis, you may experience:  Urinary symptoms, such as: ? Painful urination. ? Burning during urination. ? Frequent and sudden urges to urinate. ? Inability to start urinating. ? A weak or interrupted stream of urine.  Vomiting.  Nausea.  Fever.  Chills.  Inability to empty the bladder completely.  Pain in the: ? Muscles or joints. ? Lower back. ? Lower abdomen. If you have any of the other types of prostatitis, you may experience:  Urinary symptoms, such as: ? Sudden urges to urinate. ? Frequent urination. ? Difficulty starting urination. ? Weak  urine stream. ? Dribbling after urination.  Discharge from the urethra. The urethra is a tube that opens at the end of the penis.  Pain in the: ? Testicles. ? Penis or tip of the penis. ? Rectum. ? Area in front of the rectum and below the scrotum (perineum).  Problems with sexual function.  Painful ejaculation.  Bloody semen. How is this diagnosed? This condition may be diagnosed based on:  A physical and medical exam.  Your symptoms.  A urine test to check for bacteria.  An exam in which a health care provider uses a finger to feel the prostate (digital rectal exam).  A test of a sample of semen.  Blood tests.  Ultrasound.  Removal of prostate tissue to be examined under a microscope (biopsy).  Tests to check how your body handles urine (urodynamic tests).  A test to look inside your  bladder or urethra (cystoscopy). How is this treated? Treatment for this condition depends on the type of prostatitis. Treatment may involve:  Medicines to relieve pain or inflammation.  Medicines to help relax your muscles.  Physical therapy.  Heat therapy.  Techniques to help you control certain body functions (biofeedback).  Relaxation exercises.  Antibiotic medicine, if your condition is caused by bacteria.  Warm water baths (sitz baths). Sitz baths help with relaxing your pelvic floor muscles, which helps to relieve pressure on the prostate. Follow these instructions at home:   Take over-the-counter and prescription medicines only as told by your health care provider.  If you were prescribed an antibiotic, take it as told by your health care provider. Do not stop taking the antibiotic even if you start to feel better.  If physical therapy, biofeedback, or relaxation exercises were prescribed, do exercises as instructed.  Take sitz baths as directed by your health care provider. For a sitz bath, sit in warm water that is deep enough to cover your hips and  buttocks.  Keep all follow-up visits as told by your health care provider. This is important. Contact a health care provider if:  Your symptoms get worse.  You have a fever. Get help right away if:  You have chills.  You feel nauseous.  You vomit.  You feel light-headed or feel like you are going to faint.  You are unable to urinate.  You have blood or blood clots in your urine. This information is not intended to replace advice given to you by your health care provider. Make sure you discuss any questions you have with your health care provider. Document Released: 02/26/2000 Document Revised: 05/13/2017 Document Reviewed: 11/19/2015 Elsevier Patient Education  2020 [employer]

## 2018-09-20 NOTE — Addendum Note (Signed)
Addended by: Carlena Hurl on: 09/20/2018 10:13 AM   Modules accepted: Orders

## 2018-09-20 NOTE — Progress Notes (Signed)
Subjective:  Christopher Gay is a 63 y.o. male who presents for Chief Complaint  Patient presents with  . uti    uti dark, infection X Sunday     Here for possible urinary tract infection.  He has hx/o kidney stones and history of urinary tract infection.  This feels like the prior urinary tract infection he has had.   He does note history of weak urine stream, and does now recall history of prostatitis and swollen prostate in the past.   He does note dribbling at the end of urine stream in general.    He reports discomfort with urination for the last several days.  No blood in urine.  No fever.  No belly or back pain.  Has a general discomfort in pelvis.   Almost like a strain feeling in the groin.  Has had urinary infection at least 2 times before.   Feels sore in testicles.  He is not sexually active.  Denies concern for STD.  He does not feel very good in general.  No nausea or vomiting.  No diarrhea no bowel changes.  He is also here to discuss the recent lab results from his diabetic visit as we made some medication recommendations.  No other aggravating or relieving factors.    No other c/o.  The following portions of the patient's history were reviewed and updated as appropriate: allergies, current medications, past family history, past medical history, past social history, past surgical history and problem list.  Past Medical History:  Diagnosis Date  . Arthritis   . BPH without urinary obstruction   . Diabetes mellitus without complication ([place]) 3151   diagnosed in [place]  . Hyperlipidemia   . Hypertension     prior on medication year ago   No current outpatient medications on file prior to visit.   No current facility-administered medications on file prior to visit.     ROS Otherwise as in subjective above  Objective: BP 140/86   Pulse 80   Temp 98.2 F (36.8 C) (Temporal)   Resp 16   Wt 191 lb 12.8 oz (87 kg)   SpO2 97%   BMI 26.75 kg/m   General appearance:  alert, well developed, well nourished somewhat ill-appearing Heart: RRR, normal S1, S2, no murmurs Lungs: CTA bilaterally, no wheezes, rhonchi, or rales Abdomen: +bs, soft, mild left lower abdominal tenderness, otherwise non tender, non distended, no masses, no hepatomegaly, no splenomegaly Back nontender Pulses: 2+ radial pulses, 2+ pedal pulses, normal cap refill Ext: no edema Normal external genitalia, no testicular swelling or tenderness, no lymphadenopathy Anus normal tone, prostate seems mildly boggy and swollen   Assessment: Encounter Diagnoses  Name Primary?  . Prostatitis, unspecified prostatitis type Yes  . Type 2 diabetes mellitus with complication, without long-term current use of insulin ([place])   . Hypertension, unspecified type   . Mixed dyslipidemia   . Microalbuminuria   . error      Plan: We discussed his symptoms and concerns.  I suspect this is a prostate infection.  Begin doxycycline.  Discussed risk and benefits and proper use of medication.  We discussed usual timeframe to see improvements.  I advised if not seeing improvement within the next 4 to 5 days to call back.  We discussed symptoms that would suggest worsening infection or sepsis which would prompt a visit to the emergency department.   We discussed the med changes from your recent letter I sent you I sent medications to mail  order pharmacy [facility] today  Here are the new medication changes  Begin metformin 850 mg twice daily instead of the 500 mg twice daily  Increase to Lisinopril 10 mg daily instead of 5 mg daily  Begin Crestor 20 mg medication for cholesterol.  You can start 1/2 tablet either daily or 3 days a week if you feel more comfortable with this.  We can ease into this medication since you are a little nervous about this.  Take this at bedtime.  Often people take Crestor along with baby aspirin 81 mg at nighttime for heart health and to help with any concerns with medication side  effects such as muscle aches   For diabetes also want you to start a new medication called Rybelsus.  However I do not want you to take this until you feel much better in regards to the prostate infection.  So let us wait about a week until you begin this medication assuming you feel better.  When you do feel better you will begin 3 mg pill once daily for the next month.  After he had taken this about 2 weeks call back and let me know if you are tolerating this.  I am not going to send this to the mail order pharmacy until I know you are tolerating this.  That way we do not have you run this through insurance yet until we know you are tolerating it.   Discontinue glipizide    Christopher Gay was seen today for uti.  Diagnoses and all orders for this visit:  Prostatitis, unspecified prostatitis type  Type 2 diabetes mellitus with complication, without long-term current use of insulin (HCC)  Hypertension, unspecified type  Mixed dyslipidemia  Microalbuminuria  error -     POCT Urinalysis DIP (Proadvantage Device)  Other orders -     lisinopril (ZESTRIL) 10 MG tablet; Take 1 tablet (10 mg total) by mouth daily. -     metFORMIN (GLUCOPHAGE) 850 MG tablet; Take 1 tablet (850 mg total) by mouth 2 (two) times daily with a meal. -     rosuvastatin (CRESTOR) 20 MG tablet; Take 1 tablet (20 mg total) by mouth daily. -     doxycycline (VIBRA-TABS) 100 MG tablet; Take 1 tablet (100 mg total) by mouth 2 (two) times daily. -     Semaglutide (RYBELSUS) 3 MG TABS; Take 1 tablet by mouth daily.    Follow up: call back next week.

## 2018-09-20 NOTE — Telephone Encounter (Signed)
Pt stated he has some information to give you, he would like a call. [phone]

## 2018-09-21 ENCOUNTER — Telehealth: Payer: Self-pay | Admitting: Medical

## 2018-09-21 LAB — URINE CULTURE: Organism ID, Bacteria: NO GROWTH

## 2018-09-21 NOTE — Telephone Encounter (Signed)
Pt had called & asked stating insurance requiring P.A. Christopher Gay was not required, I called pharmacy & went thru for $360 so I  activated discount card and went thru for $10.  Advised pt of this and advised not to get this until Christopher Gay lets him know, as dosage may change also not til start the samples until Christopher Gay tells him to start.

## 2018-10-04 ENCOUNTER — Encounter (INDEPENDENT_AMBULATORY_CARE_PROVIDER_SITE_OTHER): Payer: Self-pay

## 2018-10-09 ENCOUNTER — Ambulatory Visit: Payer: Self-pay | Admitting: Medical

## 2018-10-10 ENCOUNTER — Ambulatory Visit (INDEPENDENT_AMBULATORY_CARE_PROVIDER_SITE_OTHER): Payer: Federal, State, Local not specified - PPO | Admitting: Medical

## 2018-10-10 ENCOUNTER — Encounter: Payer: Self-pay | Admitting: Medical

## 2018-10-10 ENCOUNTER — Other Ambulatory Visit: Payer: Self-pay

## 2018-10-10 VITALS — BP 126/70 | HR 78 | Temp 98.4°F | Resp 16 | Wt 194.0 lb

## 2018-10-10 DIAGNOSIS — R21 Rash and other nonspecific skin eruption: Secondary | ICD-10-CM

## 2018-10-10 DIAGNOSIS — E118 Type 2 diabetes mellitus with unspecified complications: Secondary | ICD-10-CM

## 2018-10-10 DIAGNOSIS — N419 Inflammatory disease of prostate, unspecified: Secondary | ICD-10-CM | POA: Diagnosis not present

## 2018-10-10 DIAGNOSIS — T7840XA Allergy, unspecified, initial encounter: Secondary | ICD-10-CM | POA: Diagnosis not present

## 2018-10-10 NOTE — Progress Notes (Signed)
Subjective:  Christopher Gay is a 63 y.o. male who presents for Chief Complaint  Patient presents with  . allergic reaction    allergic reaction     Here for possible allergic reaction  He reports concern about reaction to Doxycycline.  After his last visit he begin doxycycline and after about halfway through the prescription he started getting a rash on both legs.  He notes the rash was bumpy and was in patches along the groin, knees and foot.  He quit taking the doxycycline.  He has about 19 pills left.  Fortunately his prostate symptoms including urinary frequency, testicular discomfort, lower pelvic pain have all resolved at this point.  At this point the rash is gone away as well.  Last visit we also discussed starting a new medication for diabetes, Rybelsus.  He was going to wait and start this medication once he finished the antibiotic for prostatitis.  So he will begin this now.  He has no new complaints today.  No other aggravating or relieving factors.    No other c/o.  The following portions of the patient's history were reviewed and updated as appropriate: allergies, current medications, past family history, past medical history, past social history, past surgical history and problem list.  Past Medical History:  Diagnosis Date  . Arthritis   . BPH without urinary obstruction   . Diabetes mellitus without complication (HCC) 2000   diagnosed in [place]  . Hyperlipidemia   . Hypertension     prior on medication year ago   Current Outpatient Medications on File Prior to Visit  Medication Sig Dispense Refill  . lisinopril (ZESTRIL) 10 MG tablet Take 1 tablet (10 mg total) by mouth daily. 90 tablet 3  . metFORMIN (GLUCOPHAGE) 850 MG tablet Take 1 tablet (850 mg total) by mouth 2 (two) times daily with a meal. 180 tablet 0  . rosuvastatin (CRESTOR) 20 MG tablet Take 1 tablet (20 mg total) by mouth daily. 90 tablet 3  . doxycycline (VIBRA-TABS) 100 MG tablet Take 1 tablet (100 mg  total) by mouth 2 (two) times daily. (Patient not taking: Reported on 10/10/2018) 40 tablet 0  . Semaglutide (RYBELSUS) 3 MG TABS Take 1 tablet by mouth daily. (Patient not taking: Reported on 10/10/2018) 30 tablet 0   No current facility-administered medications on file prior to visit.     ROS Otherwise as in subjective above      Objective: BP 126/70   Pulse 78   Temp 98.4 F (36.9 C) (Temporal)   Resp 16   Wt 194 lb (88 kg)   SpO2 97%   BMI 27.06 kg/m   General appearance: alert, no distress, well developed, well nourished Skin: No obvious rash on his legs today Abdomen nontender today   Assessment: Encounter Diagnoses  Name Primary?  . Allergic reaction, initial encounter Yes  . Rash   . Prostatitis, unspecified prostatitis type   . Type 2 diabetes mellitus with complication, without long-term current use of insulin (HCC)      Plan: Allergic reaction/rash-we discussed that he may or may not have had an allergic reaction to doxycycline.  The rash that he was describing could have also been a plant dermatitis or other type of rash.  The rash he was describing does not sound all that typical of doxycycline related rash.  Fortunately the rash has resolved and his prostate infection symptoms have also resolved.  He will go ahead and begin Rybelsus samples we discussed last visit  to help treat his uncontrolled diabetes.  Continue metformin 850 mg twice daily  Continue lisinopril 10 mg daily  Continue Crestor 20 mg daily  [name] was seen today for allergic reaction.  Diagnoses and all orders for this visit:  Allergic reaction, initial encounter  Rash  Prostatitis, unspecified prostatitis type  Type 2 diabetes mellitus with complication, without long-term current use of insulin ([place])   Follow up: Call report within 1 month let me know if he is tolerating Rybelsus.

## 2018-10-16 ENCOUNTER — Encounter: Payer: Self-pay | Admitting: Medical

## 2018-10-16 ENCOUNTER — Ambulatory Visit (INDEPENDENT_AMBULATORY_CARE_PROVIDER_SITE_OTHER): Payer: Federal, State, Local not specified - PPO | Admitting: Medical

## 2018-10-16 ENCOUNTER — Other Ambulatory Visit: Payer: Self-pay

## 2018-10-16 VITALS — BP 120/76 | HR 80 | Temp 98.0°F | Resp 16 | Wt 191.8 lb

## 2018-10-16 DIAGNOSIS — E118 Type 2 diabetes mellitus with unspecified complications: Secondary | ICD-10-CM | POA: Diagnosis not present

## 2018-10-16 DIAGNOSIS — N419 Inflammatory disease of prostate, unspecified: Secondary | ICD-10-CM | POA: Diagnosis not present

## 2018-10-16 LAB — POCT URINALYSIS DIP (PROADVANTAGE DEVICE)
Bilirubin, UA: NEGATIVE
Blood, UA: NEGATIVE
Glucose, UA: NEGATIVE mg/dL
Leukocytes, UA: NEGATIVE
Nitrite, UA: NEGATIVE
Protein Ur, POC: NEGATIVE mg/dL
Specific Gravity, Urine: 1.02
Urobilinogen, Ur: NEGATIVE
pH, UA: 6 (ref 5.0–8.0)

## 2018-10-16 MED ORDER — TRESIBA FLEXTOUCH 100 UNIT/ML ~~LOC~~ SOPN: 5.0000 [IU] | PEN_INJECTOR | Freq: Every day | SUBCUTANEOUS | 0 refills | Status: DC

## 2018-10-16 MED ORDER — SULFAMETHOXAZOLE-TRIMETHOPRIM 800-160 MG PO TABS: 1.0000 | ORAL_TABLET | Freq: Two times a day (BID) | ORAL | 1 refills | Status: DC

## 2018-10-16 NOTE — Progress Notes (Signed)
Subjective: Chief Complaint  Patient presents with  . prostate    prostate pain    Here for possible recurrence of prostatitis  I saw him last week for possible reaction to doxycycline and rash.  At that time he felt like his prostate symptoms had resolved.  He was also having reaction to doxycycline.  So he had stopped the antibiotic about halfway through the course of the antibiotic.  However over the last 2 to 3 days his symptoms have returned.  Since last week started getting sick again.  He reports discomfort in the penis, and discomfort in the testicles, it hurts to sit at times, lower abdominal discomfort, he notes feeling sick in general.  He denies testicular swelling, no urinary changes, no penile discharge, no fever no nausea vomiting or diarrhea no bowel changes no blood in the stool or urine.  No rash.  No back pain.  He has started Rybelsus for diabetes, new medication, but then stopped it again since he started feeling sick.  We had talked about starting this cautiously as a new medication for diabetes.  But we want to make sure his prostate symptoms had resolved before starting this medication however now his symptoms have returned for prostate infection.  He is checking his blood sugars some of the time and they are running over 200 fasting.  He notes that he is taking his other medicines as usual.   Past Medical History:  Diagnosis Date  . Arthritis   . BPH without urinary obstruction   . Diabetes mellitus without complication ([place]) 5462   diagnosed in [place]  . Hyperlipidemia   . Hypertension     prior on medication year ago  . Kidney stone    Past Surgical History:  Procedure Laterality Date  . HERNIA REPAIR    . KIDNEY STONE SURGERY      ROS as in subjective    Objective: BP 120/76   Pulse 80   Temp 98 F (36.7 C) (Oral)   Resp 16   Wt 191 lb 12.8 oz (87 kg)   SpO2 98%   BMI 26.75 kg/m   Gen: wd, wn, seems a little ill appearing Skin: somewhat  pale appearing Back nontender  abdomen positive bowel sounds soft, nontender, no organomegaly no mass no rebound GU: Normal male genitalia, nontender no mass no lymphadenopathy DRE: Prostate boggy and swollen, anus normal tone     Assessment: Encounter Diagnoses  Name Primary?  . Prostatitis, unspecified prostatitis type Yes  . Type 2 diabetes mellitus with complication, without long-term current use of insulin (HCC)     Plan: We discussed his symptoms and concerns.  I suspect he did not fully treat the prostatitis from a week before last when he did not finish a full round of doxycycline although he was starting to feel better.  He will go ahead and change to Bactrim since he had a reaction to doxycycline last time.  We discussed that he may need to go anywhere from 14 to 28 days on Bactrim.  Continue to hydrate well.  Given high sugar readings he will begin sample of Tresiba long-acting insulin at bedtime, continue other medicines as usual except he will hold off on the Reybelsus oral medication.   He does not feel comfortable taking this medication at this time  F/u with call report 1 wk, sooner prn.  Patient Instructions  Recommendations:   Check your glucose in the mornings fasting  For the next 2 weeks, I  want you to use the Tresiba long-acting insulin at bedtime  If your morning sugars are greater than 200 then use 10 units of Tresiba at bedtime  If your morning sugars are under 200 then just use 5 units of Tresiba at bedtime  Continue your other medicines as usual  Begin the Bactrim antibiotic for prostate infection  You will need to do a full 2 weeks of antibiotic and maybe even a whole month if your symptoms persist  Call back by Monday to let me know if your symptoms are improving and to let me know how your blood sugars are running    Jovanni was seen today for prostate.  Diagnoses and all orders for this visit:  Prostatitis, unspecified prostatitis type -      POCT Urinalysis DIP (Proadvantage Device)  Type 2 diabetes mellitus with complication, without long-term current use of insulin (HCC)  Other orders -     sulfamethoxazole-trimethoprim (BACTRIM DS) 800-160 MG tablet; Take 1 tablet by mouth 2 (two) times daily. -     insulin degludec (TRESIBA FLEXTOUCH) 100 UNIT/ML SOPN FlexTouch Pen; Inject 0.05 mLs (5 Units total) into the skin daily at 10 pm.

## 2018-10-16 NOTE — Patient Instructions (Signed)
Recommendations:   Check your glucose in the mornings fasting  For the next 2 weeks, I want you to use the Tresiba long-acting insulin at bedtime  If your morning sugars are greater than 200 then use 10 units of Tresiba at bedtime  If your morning sugars are under 200 then just use 5 units of Tresiba at bedtime  Continue your other medicines as usual  Begin the Bactrim antibiotic for prostate infection  You will need to do a full 2 weeks of antibiotic and maybe even a whole month if your symptoms persist  Call back by Monday to let me know if your symptoms are improving and to let me know how your blood sugars are running

## 2018-10-22 ENCOUNTER — Telehealth: Payer: Self-pay | Admitting: Medical

## 2018-10-22 ENCOUNTER — Telehealth: Payer: Self-pay

## 2018-10-22 NOTE — Telephone Encounter (Signed)
Patient called and states that he is better.

## 2018-10-24 ENCOUNTER — Telehealth: Payer: Self-pay | Admitting: Medical

## 2018-10-24 ENCOUNTER — Ambulatory Visit (HOSPITAL_COMMUNITY)
Admission: EM | Admit: 2018-10-24 | Discharge: 2018-10-24 | Disposition: A | Discharge status: Home / Self Care | Payer: Federal, State, Local not specified - PPO | Attending: Family Medicine | Admitting: Family Medicine

## 2018-10-24 ENCOUNTER — Encounter (HOSPITAL_COMMUNITY): Payer: Self-pay

## 2018-10-24 ENCOUNTER — Other Ambulatory Visit: Payer: Self-pay

## 2018-10-24 DIAGNOSIS — R509 Fever, unspecified: Secondary | ICD-10-CM | POA: Insufficient documentation

## 2018-10-24 DIAGNOSIS — Z20828 Contact with and (suspected) exposure to other viral communicable diseases: Secondary | ICD-10-CM | POA: Diagnosis not present

## 2018-10-24 LAB — POCT RAPID STREP A: Streptococcus, Group A Screen (Direct): NEGATIVE

## 2018-10-24 MED ORDER — ACETAMINOPHEN 325 MG PO TABS
ORAL_TABLET | ORAL | Status: AC
  Filled 2018-10-24: qty 2

## 2018-10-24 MED ORDER — ACETAMINOPHEN 325 MG PO TABS
650.0000 mg | ORAL_TABLET | Freq: Once | ORAL | Status: AC
  Administered 2018-10-24: 650 mg via ORAL

## 2018-10-24 NOTE — ED Notes (Addendum)
Tylenol adminstered by Tressia Miners, NP prior to pt's discharge

## 2018-10-24 NOTE — ED Triage Notes (Signed)
Pt states he started taking [place] and his glands started swelling. Pt states he thinks he may have had a fever he's not sure.

## 2018-10-24 NOTE — Telephone Encounter (Signed)
Pt called and states that he has swollen glands, sore throat and hard time swallowing. He thinks its related to Trulicity. I offered to put a message back or a virtual appt. Pt states he needs to be seen. I explained to pt that with those symptoms I could not schedule him due to office protocol. Pt stated "For god's sake I will go to ER" and hung up the phone. 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

## 2018-10-24 NOTE — ED Provider Notes (Signed)
[facility]    CSN: [id number] Arrival date & time: 10/24/18  1204     History   Chief Complaint Chief Complaint  Patient presents with  . Allergic Reaction    HPI Elliot CousinScott Allensworth is a 63 y.o. male.   Patient is a 63 year old male past medical history of arthritis, BPH, diabetes, hyperlipidemia, hypertension, kidney stone.  He presents today with approximately 1 day of fever, lymphadenopathy, generalized body aches.  Symptoms have been constant.  He has been taking Tylenol for his symptoms.  He has fever of 100.3 here today.  Denies any associated cough, chest congestion, ear pain.  He has mild sore throat.  History of strep many years ago.  Went to a wedding this past weekend.  He reports he wore his mass.  No known sick contacts or COVID exposures.  Currently being treated for prostatitis.  Initially was treated with doxycycline and had some sort of allergic reaction and rash so discontinued this and was started on Bactrim.  He also admits to approximately 1 week ago cutting some grass in a field and being bit by something.  Unsure what he was bit by.  ROS per HPI      Past Medical History:  Diagnosis Date  . Arthritis   . BPH without urinary obstruction   . Diabetes mellitus without complication (HCC) 2000   diagnosed in [place]  . Hyperlipidemia   . Hypertension     prior on medication year ago  . Kidney stone     Patient Active Problem List   Diagnosis Date Noted  . Prostatitis 10/10/2018  . Type 2 diabetes mellitus with complication, without long-term current use of insulin (HCC) 09/13/2017  . Mixed dyslipidemia 09/13/2017  . Elevated LFTs 09/13/2017  . Microalbuminuria 09/13/2017  . Polyp of colon 09/13/2017  . Hypertension 06/21/2017  . Vaccine counseling 06/21/2017  . Need for pneumococcal vaccination 06/21/2017    Past Surgical History:  Procedure Laterality Date  . HERNIA REPAIR    . KIDNEY STONE SURGERY         Home Medications     Prior to Admission medications   Medication Sig Start Date End Date Taking? Authorizing Provider  insulin degludec (TRESIBA FLEXTOUCH) 100 UNIT/ML SOPN FlexTouch Pen Inject 0.05 mLs (5 Units total) into the skin daily at 10 pm. 10/16/18   Tysinger, Kermit Baloavid S, PA-C  lisinopril (ZESTRIL) 10 MG tablet Take 1 tablet (10 mg total) by mouth daily. 09/20/18   Tysinger, Kermit Baloavid S, PA-C  metFORMIN (GLUCOPHAGE) 850 MG tablet Take 1 tablet (850 mg total) by mouth 2 (two) times daily with a meal. 09/20/18   Tysinger, Kermit Baloavid S, PA-C  rosuvastatin (CRESTOR) 20 MG tablet Take 1 tablet (20 mg total) by mouth daily. 09/20/18   Tysinger, Kermit Baloavid S, PA-C  Semaglutide (RYBELSUS) 3 MG TABS Take 1 tablet by mouth daily. 09/20/18   Tysinger, Kermit Baloavid S, PA-C  sulfamethoxazole-trimethoprim (BACTRIM DS) 800-160 MG tablet Take 1 tablet by mouth 2 (two) times daily. 10/16/18   Tysinger, Kermit Baloavid S, PA-C    Family History Family History  Problem Relation Age of Onset  . Cancer Mother        breast  . Cancer Paternal Grandmother        colon    Social History Social History   Tobacco Use  . Smoking status: Former Smoker    Types: Cigars  . Smokeless tobacco: Never Used  Substance Use Topics  . Alcohol use: Yes  Comment: once a month  . Drug use: Never     Allergies   Doxycycline   Review of Systems Review of Systems   Physical Exam Triage Vital Signs ED Triage Vitals  Enc Vitals Group     BP 10/24/18 1255 (!) 108/91     Pulse Rate 10/24/18 1255 100     Resp --      Temp 10/24/18 1255 100.3 F (37.9 C)     Temp src --      SpO2 10/24/18 1255 100 %     Weight --      Height --      Head Circumference --      Peak Flow --      Pain Score 10/24/18 1347 0     Pain Loc --      Pain Edu? --      Excl. in [place]? --    No data found.  Updated Vital Signs BP (!) 108/91 (BP Location: Right Arm)   Pulse 100   Temp 100.3 F (37.9 C)   SpO2 100%   Visual Acuity Right Eye Distance:   Left Eye Distance:    Bilateral Distance:    Right Eye Near:   Left Eye Near:    Bilateral Near:     Physical Exam Vitals signs and nursing note reviewed.  Constitutional:      General: He is not in acute distress.    Appearance: He is ill-appearing. He is not toxic-appearing or diaphoretic.  HENT:     Head: Normocephalic and atraumatic.     Right Ear: Tympanic membrane and ear canal normal.     Left Ear: Tympanic membrane and ear canal normal.     Nose: Nose normal.     Mouth/Throat:     Pharynx: Oropharynx is clear. Posterior oropharyngeal erythema present.  Eyes:     Conjunctiva/sclera: Conjunctivae normal.  Neck:     Musculoskeletal: Normal range of motion.  Cardiovascular:     Pulses: Normal pulses.     Heart sounds: Normal heart sounds.  Pulmonary:     Effort: Pulmonary effort is normal.     Breath sounds: Normal breath sounds.  Musculoskeletal: Normal range of motion.  Skin:    General: Skin is warm and dry.     Findings: No rash.  Neurological:     Mental Status: He is alert.  Psychiatric:        Mood and Affect: Mood normal.      UC Treatments / Results  Labs (all labs ordered are listed, but only abnormal results are displayed) Labs Reviewed  CULTURE, GROUP A STREP ([place])  NOVEL CORONAVIRUS, NAA (HOSPITAL ORDER, SEND-OUT TO REF LAB)  POCT RAPID STREP A    EKG   Radiology No results found.  Procedures Procedures (including critical care time)  Medications Ordered in UC Medications  acetaminophen (TYLENOL) tablet 650 mg (650 mg Oral Given by Other 10/24/18 1347)  acetaminophen (TYLENOL) 325 MG tablet (has no administration in time range)  acetaminophen (TYLENOL) 325 MG tablet (has no administration in time range)    Initial Impression / Assessment and Plan / UC Course  I have reviewed the triage vital signs and the nursing notes.  Pertinent labs & imaging results that were available during my care of the patient were reviewed by me and considered in my medical  decision making (see chart for details).     Rapid strep test negative.  Sending for culture COVID testing done  based on fever, symptoms Likely viral pharyngitis if COVID is negative. Tylenol given here for fever Precautions given Follow up as needed for continued or worsening symptoms  Final Clinical Impressions(s) / UC Diagnoses   Final diagnoses:  Fever, unspecified     Discharge Instructions     Your rapid strep test was negative.  We will send this for culture We are also testing you for COVID.  We should have these results in a couple of days. You can take Tylenol for body aches and fever Follow up as needed for continued or worsening symptoms     ED Prescriptions    None     Controlled Substance Prescriptions [place] Controlled Substance Registry consulted? Not Applicable   Janace ArisBast, Sriram Febles A, NP 10/24/18 1433

## 2018-10-24 NOTE — Discharge Instructions (Signed)
Your rapid strep test was negative.  We will send this for culture We are also testing you for COVID.  We should have these results in a couple of days. You can take Tylenol for body aches and fever Follow up as needed for continued or worsening symptoms

## 2018-10-24 NOTE — Telephone Encounter (Signed)
No follow-up needed since he says he is can go to the ER

## 2018-10-26 LAB — NOVEL CORONAVIRUS, NAA (HOSP ORDER, SEND-OUT TO REF LAB; TAT 18-24 HRS): SARS-CoV-2, NAA: NOT DETECTED

## 2018-10-26 LAB — CULTURE, GROUP A STREP (THRC)

## 2018-11-20 ENCOUNTER — Encounter: Payer: Self-pay | Admitting: Medical

## 2018-11-20 ENCOUNTER — Telehealth: Payer: Self-pay | Admitting: Medical

## 2018-11-20 ENCOUNTER — Other Ambulatory Visit: Payer: Self-pay

## 2018-11-20 ENCOUNTER — Ambulatory Visit (INDEPENDENT_AMBULATORY_CARE_PROVIDER_SITE_OTHER): Payer: Federal, State, Local not specified - PPO | Admitting: Medical

## 2018-11-20 VITALS — BP 130/82 | HR 80 | Temp 97.9°F | Ht 71.0 in | Wt 184.8 lb

## 2018-11-20 DIAGNOSIS — R809 Proteinuria, unspecified: Secondary | ICD-10-CM

## 2018-11-20 DIAGNOSIS — E785 Hyperlipidemia, unspecified: Secondary | ICD-10-CM | POA: Diagnosis not present

## 2018-11-20 DIAGNOSIS — I10 Essential (primary) hypertension: Secondary | ICD-10-CM | POA: Diagnosis not present

## 2018-11-20 DIAGNOSIS — E1165 Type 2 diabetes mellitus with hyperglycemia: Secondary | ICD-10-CM

## 2018-11-20 DIAGNOSIS — Z23 Encounter for immunization: Secondary | ICD-10-CM | POA: Insufficient documentation

## 2018-11-20 DIAGNOSIS — E1159 Type 2 diabetes mellitus with other circulatory complications: Secondary | ICD-10-CM | POA: Diagnosis not present

## 2018-11-20 DIAGNOSIS — I152 Hypertension secondary to endocrine disorders: Secondary | ICD-10-CM

## 2018-11-20 DIAGNOSIS — B351 Tinea unguium: Secondary | ICD-10-CM | POA: Insufficient documentation

## 2018-11-20 NOTE — Telephone Encounter (Signed)
Christopher Gay We decided to go with Tresiba basal insulin, not the tablet Rybelsus.   When you spoke to insurance, did you by chance get any info on covereage for any of the basal insulins such as [name], basaglar, [name], Lantus?

## 2018-11-20 NOTE — Progress Notes (Signed)
Subjective: Chief Complaint  Patient presents with  . Diabetes   Here for diabetes f/u.  Diabetes - in recent weeks he was having prostate issues, prostatitis, and finally finished antibiotic.  He ended up not starting Rybelsus, but instead wanted to continue with metformin and [place] combo.  Compliant with Metformin 850mg  BID.  Compliant with Tresiba at between 5-10 units daily based on sugar readings we discussed last time.  Trying to eat healthy, doing some walking.   Still seeing high 100s, but much better than 200s he was seeing.  Feels better, has lost some weight, eating better.   No other aggravating or relieving factors.   Hypertension-compliant with lisinopril 10 mg daily started a few months ago.  Denies chest pain, no swelling in legs, no dyspnea on exertion  Hyperlipidemia-compliant with Crestor 20 mg daily starting this year.  No reported side effects  A few weeks ago had some swollen glands in neck while working, felt feverish 100.3 temp.   Went to urgent care, was ultimately thought to have bug bites, was using hydration and tylenol.   Ended up getting better.   Was upset when he called here, was thought to have covid based on swollen glands without other Covid symptoms.    No other new complaint  Past Medical History:  Diagnosis Date  . Arthritis   . BPH without urinary obstruction   . Diabetes mellitus without complication (HCC) 2000   diagnosed in [place]  . Hyperlipidemia   . Hypertension     prior on medication year ago  . Kidney stone    Current Outpatient Medications on File Prior to Visit  Medication Sig Dispense Refill  . insulin degludec (TRESIBA FLEXTOUCH) 100 UNIT/ML SOPN FlexTouch Pen Inject 0.05 mLs (5 Units total) into the skin daily at 10 pm. 3 mL 0  . lisinopril (ZESTRIL) 10 MG tablet Take 1 tablet (10 mg total) by mouth daily. 90 tablet 3  . metFORMIN (GLUCOPHAGE) 850 MG tablet Take 1 tablet (850 mg total) by mouth 2 (two) times daily with a meal.  180 tablet 0  . rosuvastatin (CRESTOR) 20 MG tablet Take 1 tablet (20 mg total) by mouth daily. 90 tablet 3   No current facility-administered medications on file prior to visit.    ROS as in subjective   Objective: BP 130/82   Pulse 80   Temp 97.9 F (36.6 C) (Oral)   Ht 5\' 11"  (1.803 m)   Wt 184 lb 12.8 oz (83.8 kg)   SpO2 99%   BMI 25.77 kg/m   Wt Readings from Last 3 Encounters:  11/20/18 184 lb 12.8 oz (83.8 kg)  10/16/18 191 lb 12.8 oz (87 kg)  10/10/18 194 lb (88 kg)   BP Readings from Last 3 Encounters:  11/20/18 130/82  10/24/18 (!) 108/91  10/16/18 120/76   Gen: wd, wn, nad Lungs clear Heart rrr, normal s1, s2, no murmur Ext: no edema Pulses 2+ UE and LE   Diabetic Foot Exam - Simple   Simple Foot Form Diabetic Foot exam was performed with the following findings: Yes 11/20/2018 10:13 AM  Visual Inspection See comments: Yes Sensation Testing Intact to touch and monofilament testing bilaterally: Yes Pulse Check Posterior Tibialis and Dorsalis pulse intact bilaterally: Yes Comments Nails throughout, yellowish coloration and thickened nails, otherwise no lesions        Assessment: Encounter Diagnoses  Name Primary?  Marland Kitchen. Uncontrolled type 2 diabetes mellitus with hyperglycemia (HCC) Yes  . Hypertension associated with  type 2 diabetes mellitus ([place])   . Hyperlipidemia, unspecified hyperlipidemia type   . Need for influenza vaccination   . Microalbuminuria   . Onychomycosis     Plan: Diabetes - improved on tresiba up to 10u daily and Metformin 850mg  BID.  Labs today, c/t glucose monitoring, counseled on diet, c/t exercise on treadmill and walking.  C/t foot checks daily, yearly eye doctor visits.   I expressed empathy for his recently frustration with our office; sounds like a miscommunication when he called in about lymph nodes swollen.   He ultimately was seen at urgent care.  HTN - controlled on current medication  Hyperlipidemia - labs today,  c/t crestor, tolerating this fine  onychomycosis - prior treatment with Lamisil, reportedly had concerns about liver.  No other treatment.  Discussed jublia. He will consider but declines today  Counseled on the influenza virus vaccine.  Vaccine information sheet given.  Influenza vaccine given after consent obtained.  Tyriek was seen today for diabetes.  Diagnoses and all orders for this visit:  Uncontrolled type 2 diabetes mellitus with hyperglycemia ([place]) -     Comprehensive metabolic panel -     Hemoglobin A1c -     Lipid panel  Hypertension associated with type 2 diabetes mellitus (HCC) -     Comprehensive metabolic panel  Hyperlipidemia, unspecified hyperlipidemia type -     Lipid panel  Need for influenza vaccination -     Flu Vaccine QUAD 6+ mos PF IM (Fluarix Quad PF)  Microalbuminuria  Onychomycosis

## 2018-11-21 ENCOUNTER — Other Ambulatory Visit: Payer: Self-pay | Admitting: Medical

## 2018-11-21 LAB — COMPREHENSIVE METABOLIC PANEL
ALT: 25 IU/L (ref 0–44)
AST: 21 IU/L (ref 0–40)
Albumin/Globulin Ratio: 2.1 (ref 1.2–2.2)
Albumin: 5 g/dL — ABNORMAL HIGH (ref 3.8–4.8)
Alkaline Phosphatase: 56 IU/L (ref 39–117)
BUN/Creatinine Ratio: 20 (ref 10–24)
BUN: 15 mg/dL (ref 8–27)
Bilirubin Total: 0.9 mg/dL (ref 0.0–1.2)
CO2: 23 mmol/L (ref 20–29)
Calcium: 10.1 mg/dL (ref 8.6–10.2)
Chloride: 98 mmol/L (ref 96–106)
Creatinine, Ser: 0.76 mg/dL (ref 0.76–1.27)
GFR calc Af Amer: 112 mL/min/{1.73_m2} (ref >59)
GFR calc non Af Amer: 97 mL/min/{1.73_m2} (ref >59)
Globulin, Total: 2.4 g/dL (ref 1.5–4.5)
Glucose: 164 mg/dL — ABNORMAL HIGH (ref 65–99)
Potassium: 4.4 mmol/L (ref 3.5–5.2)
Sodium: 137 mmol/L (ref 134–144)
Total Protein: 7.4 g/dL (ref 6.0–8.5)

## 2018-11-21 LAB — LIPID PANEL
Chol/HDL Ratio: 3.3 ratio (ref 0.0–5.0)
Cholesterol, Total: 134 mg/dL (ref 100–199)
HDL: 41 mg/dL (ref >39)
LDL Chol Calc (NIH): 73 mg/dL (ref 0–99)
Triglycerides: 109 mg/dL (ref 0–149)
VLDL Cholesterol Cal: 20 mg/dL (ref 5–40)

## 2018-11-21 LAB — HEMOGLOBIN A1C
Est. average glucose Bld gHb Est-mCnc: 157 mg/dL
Hgb A1c MFr Bld: 7.1 % — ABNORMAL HIGH (ref 4.8–5.6)

## 2018-11-21 MED ORDER — BD PEN NEEDLE NANO U/F 32G X 4 MM MISC: 1.0000 | Freq: Every day | 11 refills | Status: DC

## 2018-11-21 MED ORDER — METFORMIN HCL 850 MG PO TABS: 850.0000 mg | ORAL_TABLET | Freq: Two times a day (BID) | ORAL | 0 refills | Status: DC

## 2018-11-21 MED ORDER — TRESIBA FLEXTOUCH 100 UNIT/ML ~~LOC~~ SOPN: 15.0000 [IU] | PEN_INJECTOR | Freq: Every day | SUBCUTANEOUS | 2 refills | Status: DC

## 2018-11-28 NOTE — Telephone Encounter (Signed)
Called [facility] & Christopher Gay covered went thru for $24.99 for 30+ days.  Pt picked up

## 2018-12-11 ENCOUNTER — Other Ambulatory Visit: Payer: Self-pay

## 2018-12-11 ENCOUNTER — Ambulatory Visit (HOSPITAL_COMMUNITY)
Admission: EM | Admit: 2018-12-11 | Discharge: 2018-12-11 | Disposition: A | Discharge status: Home / Self Care | Payer: Federal, State, Local not specified - PPO | Attending: Family Medicine | Admitting: Family Medicine

## 2018-12-11 ENCOUNTER — Encounter (HOSPITAL_COMMUNITY): Payer: Self-pay

## 2018-12-11 DIAGNOSIS — K6289 Other specified diseases of anus and rectum: Secondary | ICD-10-CM | POA: Diagnosis not present

## 2018-12-11 DIAGNOSIS — K602 Anal fissure, unspecified: Secondary | ICD-10-CM | POA: Diagnosis not present

## 2018-12-11 LAB — POCT URINALYSIS DIP (DEVICE)
Bilirubin Urine: NEGATIVE
Glucose, UA: NEGATIVE mg/dL
Hgb urine dipstick: NEGATIVE
Ketones, ur: NEGATIVE mg/dL
Leukocytes,Ua: NEGATIVE
Nitrite: NEGATIVE
Protein, ur: NEGATIVE mg/dL
Specific Gravity, Urine: 1.015 (ref 1.005–1.030)
Urobilinogen, UA: 0.2 mg/dL (ref 0.0–1.0)
pH: 6 (ref 5.0–8.0)

## 2018-12-11 MED ORDER — "NITROGLYCERIN NICU 2% OINTMENT ": 1.0000 "application " | TOPICAL_OINTMENT | Freq: Two times a day (BID) | TRANSDERMAL | 0 refills | Status: DC

## 2018-12-11 MED ORDER — LIDOCAINE 5 % EX OINT: 1.0000 "application " | TOPICAL_OINTMENT | Freq: Three times a day (TID) | CUTANEOUS | 0 refills | Status: DC | PRN

## 2018-12-11 NOTE — Discharge Instructions (Signed)
The most common cause of rectal pain is an anal fissure This causes spasms of the rectal muscles that are painful The treatment is nitroglycerin ointment around the exterior 2 x a day This helps to reduce pain Drinking water and taking fiber help to reduce chances of constipation while it heals

## 2018-12-11 NOTE — ED Provider Notes (Signed)
[facility]    CSN: [id number] Arrival date & time: 12/11/18  1426      History   Chief Complaint Chief Complaint  Patient presents with  . rectal pain    HPI Christopher Gay is a 63 y.o. male.   HPI  Pleasant 63 year old male who is here for rectal pain.  He states it is been going on for a couple of days.  He states it hurts off and on all day long.  It does hurt with a bowel movement.  No blood in bowels.  No constipation.  No diarrhea.  No urinary frequency or difficulty urinating.  No fever or chills.  No nocturia.  No history of prostate problems.  Patient thinks it is his prostate because it is in his rectal area, but he has not had prostate infections or prostate enlargement in the past.  Past Medical History:  Diagnosis Date  . Arthritis   . BPH without urinary obstruction   . Diabetes mellitus without complication (HCC) 2000   [place]  . Hyperlipidemia   . Hypertension     prior on medication year ago  . Kidney stone     Patient Active Problem List   Diagnosis Date Noted  . Uncontrolled type 2 diabetes mellitus with hyperglycemia (HCC) 11/20/2018  . Need for influenza vaccination 11/20/2018  . Onychomycosis 11/20/2018  . Prostatitis 10/10/2018  . Mixed dyslipidemia 09/13/2017  . Elevated LFTs 09/13/2017  . Microalbuminuria 09/13/2017  . Polyp of colon 09/13/2017  . Hypertension associated with type 2 diabetes mellitus (HCC) 06/21/2017  . Hyperlipidemia 06/21/2017  . Vaccine counseling 06/21/2017  . Need for pneumococcal vaccination 06/21/2017    Past Surgical History:  Procedure Laterality Date  . HERNIA REPAIR    . KIDNEY STONE SURGERY         Home Medications    Prior to Admission medications   Medication Sig Start Date End Date Taking? Authorizing Provider  insulin degludec (TRESIBA FLEXTOUCH) 100 UNIT/ML SOPN FlexTouch Pen Inject 0.15 mLs (15 Units total) into the skin daily at 10 pm. 11/21/18   Tysinger, Kermit Baloavid S, PA-C   Insulin Pen Needle (BD PEN NEEDLE NANO U/F) 32G X 4 MM MISC 1 each by Does not apply route at bedtime. 11/21/18   Tysinger, Kermit Baloavid S, PA-C  lidocaine (XYLOCAINE) 5 % ointment Apply 1 application topically 3 (three) times daily as needed. 12/11/18   Eustace MooreNelson,  Sue, MD  lisinopril (ZESTRIL) 10 MG tablet Take 1 tablet (10 mg total) by mouth daily. 09/20/18   Tysinger, Kermit Baloavid S, PA-C  metFORMIN (GLUCOPHAGE) 850 MG tablet Take 1 tablet (850 mg total) by mouth 2 (two) times daily with a meal. 11/21/18   Tysinger, Kermit Baloavid S, PA-C  nitroGLYCERIN (NITROGLYN) 2 % OINT ointment Apply 1 application topically every 12 (twelve) hours. 12/11/18   Eustace MooreNelson,  Sue, MD  rosuvastatin (CRESTOR) 20 MG tablet Take 1 tablet (20 mg total) by mouth daily. 09/20/18   Tysinger, Kermit Baloavid S, PA-C    Family History Family History  Problem Relation Age of Onset  . Cancer Mother        breast  . Cancer Paternal Grandmother        colon    Social History Social History   Tobacco Use  . Smoking status: Former Smoker    Types: Cigars  . Smokeless tobacco: Never Used  Substance Use Topics  . Alcohol use: Yes    Comment: once a month  . Drug use:  Never     Allergies   Doxycycline   Review of Systems Review of Systems  Constitutional: Negative for chills and fever.  HENT: Negative for ear pain and sore throat.   Eyes: Negative for pain and visual disturbance.  Respiratory: Negative for cough and shortness of breath.   Cardiovascular: Negative for chest pain and palpitations.  Gastrointestinal: Positive for rectal pain. Negative for abdominal pain, blood in stool, constipation, diarrhea and vomiting.  Genitourinary: Negative for dysuria and hematuria.  Musculoskeletal: Negative for arthralgias and back pain.  Skin: Negative for color change and rash.  Neurological: Negative for seizures and syncope.  All other systems reviewed and are negative.    Physical Exam Triage Vital Signs ED Triage Vitals [12/11/18  1449]  Enc Vitals Group     BP (!) 142/71     Pulse Rate 80     Resp 18     Temp 98.2 F (36.8 C)     Temp Source Oral     SpO2 99 %     Weight 185 lb (83.9 kg)     Height      Head Circumference      Peak Flow      Pain Score 5     Pain Loc      Pain Edu?      Excl. in [place]?    No data found.  Updated Vital Signs BP (!) 142/71 (BP Location: Right Arm)   Pulse 80   Temp 98.2 F (36.8 C) (Oral)   Resp 18   Wt 83.9 kg   SpO2 99%   BMI 25.80 kg/m       Physical Exam Constitutional:      General: He is not in acute distress.    Appearance: He is well-developed.  HENT:     Head: Normocephalic and atraumatic.  Eyes:     Conjunctiva/sclera: Conjunctivae normal.     Pupils: Pupils are equal, round, and reactive to light.  Neck:     Musculoskeletal: Normal range of motion.  Cardiovascular:     Rate and Rhythm: Normal rate.  Pulmonary:     Effort: Pulmonary effort is normal. No respiratory distress.  Abdominal:     General: There is no distension.     Palpations: Abdomen is soft.  Genitourinary:    Penis: Normal.      Scrotum/Testes: Normal.     Rectum: Normal.     Comments: Rectum appears normal.  No internal or external hemorrhoids noted.  No anal fissures seen from the exterior.  Prostate is firm, mildly enlarged, no nodules.  No tenderness. Musculoskeletal: Normal range of motion.  Skin:    General: Skin is warm and dry.  Neurological:     Mental Status: He is alert.  Psychiatric:        Mood and Affect: Mood normal.        Behavior: Behavior normal.      UC Treatments / Results  Labs (all labs ordered are listed, but only abnormal results are displayed) Labs Reviewed  POCT URINALYSIS DIP (DEVICE)    EKG   Radiology No results found.  Procedures Procedures (including critical care time)  Medications Ordered in UC Medications - No data to display  Initial Impression / Assessment and Plan / UC Course  I have reviewed the triage vital signs  and the nursing notes.  Pertinent labs & imaging results that were available during my care of the patient were reviewed by me and  considered in my medical decision making (see chart for details).     Is common cause of rectal pain would be an anal fissure.  I did not see it, but the tenderness is right at his anal verge.  We will treat, and have him follow-up with his PCP Final Clinical Impressions(s) / UC Diagnoses   Final diagnoses:  Anal fissure  Rectal or anal pain     Discharge Instructions     The most common cause of rectal pain is an anal fissure This causes spasms of the rectal muscles that are painful The treatment is nitroglycerin ointment around the exterior 2 x a day This helps to reduce pain Drinking water and taking fiber help to reduce chances of constipation while it heals     ED Prescriptions    Medication Sig Dispense Auth. Provider   nitroGLYCERIN (NITROGLYN) 2 % OINT ointment Apply 1 application topically every 12 (twelve) hours. 30 g Eustace Moore, MD   lidocaine (XYLOCAINE) 5 % ointment Apply 1 application topically 3 (three) times daily as needed. 35.44 g Eustace Moore, MD     PDMP not reviewed this encounter.   Eustace Moore, MD 12/11/18 [phone]

## 2018-12-11 NOTE — ED Triage Notes (Signed)
Pt states he has rectal pain. Pt states he thinks its his Prostate issue. Pt states he's not sleeping.

## 2018-12-12 ENCOUNTER — Ambulatory Visit: Payer: Federal, State, Local not specified - PPO | Admitting: Medical

## 2019-02-19 ENCOUNTER — Ambulatory Visit: Payer: Federal, State, Local not specified - PPO | Admitting: Medical

## 2019-02-28 ENCOUNTER — Other Ambulatory Visit: Payer: Self-pay | Admitting: Medical

## 2019-03-25 ENCOUNTER — Encounter: Payer: Federal, State, Local not specified - PPO | Admitting: Medical

## 2019-04-15 ENCOUNTER — Other Ambulatory Visit: Payer: Self-pay

## 2019-04-15 ENCOUNTER — Ambulatory Visit: Payer: Federal, State, Local not specified - PPO | Admitting: Medical

## 2019-04-15 ENCOUNTER — Encounter: Payer: Self-pay | Admitting: Medical

## 2019-04-15 VITALS — BP 134/70 | HR 87 | Temp 98.0°F | Ht 71.0 in | Wt 191.0 lb

## 2019-04-15 DIAGNOSIS — Z7185 Encounter for immunization safety counseling: Secondary | ICD-10-CM

## 2019-04-15 DIAGNOSIS — E1165 Type 2 diabetes mellitus with hyperglycemia: Secondary | ICD-10-CM

## 2019-04-15 DIAGNOSIS — I152 Hypertension secondary to endocrine disorders: Secondary | ICD-10-CM

## 2019-04-15 DIAGNOSIS — R809 Proteinuria, unspecified: Secondary | ICD-10-CM

## 2019-04-15 DIAGNOSIS — I10 Essential (primary) hypertension: Secondary | ICD-10-CM

## 2019-04-15 DIAGNOSIS — E785 Hyperlipidemia, unspecified: Secondary | ICD-10-CM

## 2019-04-15 DIAGNOSIS — Z125 Encounter for screening for malignant neoplasm of prostate: Secondary | ICD-10-CM

## 2019-04-15 DIAGNOSIS — N400 Enlarged prostate without lower urinary tract symptoms: Secondary | ICD-10-CM | POA: Diagnosis not present

## 2019-04-15 DIAGNOSIS — Z7189 Other specified counseling: Secondary | ICD-10-CM

## 2019-04-15 DIAGNOSIS — E1159 Type 2 diabetes mellitus with other circulatory complications: Secondary | ICD-10-CM | POA: Diagnosis not present

## 2019-04-15 MED ORDER — METFORMIN HCL 850 MG PO TABS: 850.0000 mg | ORAL_TABLET | Freq: Two times a day (BID) | ORAL | 1 refills | Status: DC

## 2019-04-15 MED ORDER — TRESIBA FLEXTOUCH 100 UNIT/ML ~~LOC~~ SOPN: 15.0000 [IU] | PEN_INJECTOR | Freq: Every day | SUBCUTANEOUS | 2 refills | Status: DC

## 2019-04-15 MED ORDER — TRESIBA FLEXTOUCH 100 UNIT/ML ~~LOC~~ SOPN: 25.0000 [IU] | PEN_INJECTOR | Freq: Every day | SUBCUTANEOUS | 2 refills | Status: DC

## 2019-04-15 NOTE — Progress Notes (Signed)
Subjective: Chief Complaint  Patient presents with  . Medication Management    with fasting labs    Here for diabetes management.  He is compliant with Metformin 850 mg twice daily and Tresiba 15 units daily.  However the pharmacy recently refilled Metformin 500 mg 2 tablets twice a day which was discontinued a few months ago at his last visit.  He did not realize until he got home and ended up throwing those away.  The higher dose upsets his stomach.  He has to take the Metformin with food.  Blood sugars generally running 170s in the morning, but better in the evening.  He normally eats oatmeal every morning, eats a small snack in the afternoon and then has a bigger meal in the evening but is very careful with his diet so he is frustrated that the sugars are still running high.  He does attribute some of this to holiday eating over Christmas.  He recently got protein powder to do protein supplement as well as some vitamins from [facility].  This has made him feel more energetic.  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  He gets irritated some of the older folks in the area that do not wear a mask and continue to eat heavy biscuits and gravy every morning  Past Medical History:  Diagnosis Date  . Arthritis   . BPH without urinary obstruction   . Diabetes mellitus without complication (HCC) 2000   diagnosed in [place]  . Hyperlipidemia   . Hypertension     prior on medication year ago  . Kidney stone    Current Outpatient Medications on File Prior to Visit  Medication Sig Dispense Refill  . Insulin Pen Needle (BD PEN NEEDLE NANO U/F) 32G X 4 MM MISC 1 each by Does not apply route at bedtime. 100 each 11  . lisinopril (ZESTRIL) 10 MG tablet Take 1 tablet (10 mg total) by mouth daily. 90 tablet  3  . rosuvastatin (CRESTOR) 20 MG tablet Take 1 tablet (20 mg total) by mouth daily. 90 tablet 3  . lidocaine (XYLOCAINE) 5 % ointment Apply 1 application topically 3 (three) times daily as needed. (Patient not taking: Reported on 04/15/2019) 35.44 g 0  . nitroGLYCERIN (NITROGLYN) 2 % OINT ointment Apply 1 application topically every 12 (twelve) hours. (Patient not taking: Reported on 04/15/2019) 30 g 0   No current facility-administered medications on file prior to visit.   ROS as in subjective   Objective: BP 134/70   Pulse 87   Temp 98 F (36.7 C)   Ht 5\' 11"  (1.803 m)   Wt 191 lb (86.6 kg)   SpO2 97%   BMI 26.64 kg/m   General appearence: alert, no distress, WD/WN,  Heart: RRR, normal S1, S2, no murmurs Lungs: CTA bilaterally, no wheezes, rhonchi, or rales Pulses: 2+ symmetric, upper and 1-2+ lower extremities, normal cap refill in hands but slightly decreased cap refill in feet Psych: Pleasant, answers questions appropriately No lower extremity edema  Diabetic Foot Exam - Simple   Simple Foot Form Visual Inspection See comments: Yes Sensation Testing Intact to touch and monofilament testing bilaterally: Yes Pulse Check See comments: Yes Comments 1+ decreased pulses in feet, yellowish toenails throughout slightly thickened toenails particularly of the great toenails, otherwise no foot lesions  Assessment: Encounter Diagnoses  Name Primary?  Marland Kitchen Uncontrolled type 2 diabetes mellitus with hyperglycemia ([place]) Yes  . Hypertension associated with type 2 diabetes mellitus ([place])   . Hyperlipidemia, unspecified hyperlipidemia type   . Vaccine counseling   . Microalbuminuria   . BPH without obstruction/lower urinary tract symptoms   . Screening for prostate cancer      Plan: Diabetes-counseled on diet, exercise, glucose monitoring, advised we can increase [place] 2 units/week until getting to goal blood sugars fasting.  Continue healthy diet.  Continue daily foot  checks.  Hypertension-continue current meds  hyperlipidemia-continue current medication, lipid panel at goal back in September 2020  Microalbuminuria-last check back 7 months ago was improved from the prior reading the addition of ACE inhibitor  Counseled on Covid vaccine and advised to get this when it becomes available soon.  We will plan on doing Prevnar 13 vaccine several weeks after his second dose of Covid vaccine.  BPH, screen for prostate cancer-PSA today  Christopher Gay was seen today for medication management.  Diagnoses and all orders for this visit:  Uncontrolled type 2 diabetes mellitus with hyperglycemia ([place]) -     Comprehensive metabolic panel -     Hemoglobin A1c  Hypertension associated with type 2 diabetes mellitus (HCC) -     Comprehensive metabolic panel -     Hemoglobin A1c  Hyperlipidemia, unspecified hyperlipidemia type  Vaccine counseling  Microalbuminuria  BPH without obstruction/lower urinary tract symptoms -     PSA  Screening for prostate cancer -     PSA  Other orders -     Discontinue: insulin degludec (TRESIBA FLEXTOUCH) 100 UNIT/ML SOPN FlexTouch Pen; Inject 0.15 mLs (15 Units total) into the skin daily at 10 pm. -     insulin degludec (TRESIBA FLEXTOUCH) 100 UNIT/ML SOPN FlexTouch Pen; Inject 0.25 mLs (25 Units total) into the skin daily at 10 pm. -     metFORMIN (GLUCOPHAGE) 850 MG tablet; Take 1 tablet (850 mg total) by mouth 2 (two) times daily with a meal.

## 2019-04-16 ENCOUNTER — Other Ambulatory Visit: Payer: Self-pay | Admitting: Medical

## 2019-04-16 LAB — COMPREHENSIVE METABOLIC PANEL
ALT: 28 IU/L (ref 0–44)
AST: 22 IU/L (ref 0–40)
Albumin/Globulin Ratio: 2.2 (ref 1.2–2.2)
Albumin: 4.7 g/dL (ref 3.8–4.8)
Alkaline Phosphatase: 57 IU/L (ref 39–117)
BUN/Creatinine Ratio: 24 (ref 10–24)
BUN: 17 mg/dL (ref 8–27)
Bilirubin Total: 0.5 mg/dL (ref 0.0–1.2)
CO2: 22 mmol/L (ref 20–29)
Calcium: 9.9 mg/dL (ref 8.6–10.2)
Chloride: 101 mmol/L (ref 96–106)
Creatinine, Ser: 0.72 mg/dL — ABNORMAL LOW (ref 0.76–1.27)
GFR calc Af Amer: 115 mL/min/{1.73_m2} (ref >59)
GFR calc non Af Amer: 99 mL/min/{1.73_m2} (ref >59)
Globulin, Total: 2.1 g/dL (ref 1.5–4.5)
Glucose: 260 mg/dL — ABNORMAL HIGH (ref 65–99)
Potassium: 4.8 mmol/L (ref 3.5–5.2)
Sodium: 138 mmol/L (ref 134–144)
Total Protein: 6.8 g/dL (ref 6.0–8.5)

## 2019-04-16 LAB — HEMOGLOBIN A1C
Est. average glucose Bld gHb Est-mCnc: 194 mg/dL
Hgb A1c MFr Bld: 8.4 % — ABNORMAL HIGH (ref 4.8–5.6)

## 2019-04-17 LAB — PSA: Prostate Specific Ag, Serum: 0.4 ng/mL (ref 0.0–4.0)

## 2019-05-08 ENCOUNTER — Other Ambulatory Visit: Payer: Self-pay

## 2019-05-08 ENCOUNTER — Encounter: Payer: Self-pay | Admitting: Medical

## 2019-05-08 ENCOUNTER — Ambulatory Visit: Payer: Federal, State, Local not specified - PPO | Admitting: Medical

## 2019-05-08 VITALS — BP 130/76 | HR 82 | Temp 98.3°F | Ht 71.0 in | Wt 190.0 lb

## 2019-05-08 DIAGNOSIS — K6289 Other specified diseases of anus and rectum: Secondary | ICD-10-CM

## 2019-05-08 MED ORDER — HYDROCORTISONE ACETATE 25 MG RE SUPP: 25.0000 mg | Freq: Two times a day (BID) | RECTAL | 0 refills | Status: DC

## 2019-05-08 MED ORDER — HYDROCORTISONE 2.5 % EX CREA: TOPICAL_CREAM | Freq: Two times a day (BID) | CUTANEOUS | 1 refills | Status: DC

## 2019-05-08 MED ORDER — SITZ BATH MISC: 1.0000 | Freq: Every day | 1 refills | Status: DC | PRN

## 2019-05-08 NOTE — Progress Notes (Signed)
Subjective: Chief Complaint  Patient presents with  . Rectal Pain   He notes hx/o anal fissure.   He was lifting some tractor weights a week ago and thinks he caused a new fissure.  In the past was prescribed cream for this, no prior surgery.  No hx/o thrombosed hemorrhoids.  First noticed this a week ago with discomfort at the anus.   Walking irritates the anus region.   Wiping doesn't hurt.   BMs are fine, not hard, no constipation. No blood on the toilet paper, no blood in stool.  Using leftover rectal cream.  No other aggravating or relieving factors. No other complaint.  Current Outpatient Medications on File Prior to Visit  Medication Sig Dispense Refill  . insulin degludec (TRESIBA FLEXTOUCH) 100 UNIT/ML SOPN FlexTouch Pen Inject 0.25 mLs (25 Units total) into the skin daily at 10 pm. 6 mL 2  . Insulin Pen Needle (BD PEN NEEDLE NANO U/F) 32G X 4 MM MISC 1 each by Does not apply route at bedtime. 100 each 11  . lisinopril (ZESTRIL) 10 MG tablet Take 1 tablet (10 mg total) by mouth daily. 90 tablet 3  . metFORMIN (GLUCOPHAGE) 850 MG tablet Take 1 tablet (850 mg total) by mouth 2 (two) times daily with a meal. 180 tablet 1  . rosuvastatin (CRESTOR) 20 MG tablet Take 1 tablet (20 mg total) by mouth daily. 90 tablet 3  . lidocaine (XYLOCAINE) 5 % ointment Apply 1 application topically 3 (three) times daily as needed. (Patient not taking: Reported on 04/15/2019) 35.44 g 0  . nitroGLYCERIN (NITROGLYN) 2 % OINT ointment Apply 1 application topically every 12 (twelve) hours. (Patient not taking: Reported on 04/15/2019) 30 g 0   No current facility-administered medications on file prior to visit.    ROS as in subjective   Objective: BP 130/76   Pulse 82   Temp 98.3 F (36.8 C)   Ht 5\' 11"  (1.803 m)   Wt 190 lb (86.2 kg)   SpO2 99%   BMI 26.50 kg/m   Gen: wd, wn, nad Rectal exam - mild erythema/pink coloration around the anus, mildly tender at anus ,but no obvious fissure, no obvious  hemorrhoids, no swelling, no signs of abscess.      Assessment Encounter Diagnosis  Name Primary?  . Rectal discomfort Yes     Plan Discussed findings, symptoms, discussed difference between hemorrhoids, fissure, other.   Begin SITZ baths, hydrocortisone cream and can use suppository the next few days.   Avoid foods that constipated or heavy to digest the next several days.   Continue to hydrate well and get good fiber in the diet.   If not much improved within 3-5 days, call back.     Christopher Gay was seen today for rectal pain.  Diagnoses and all orders for this visit:  Rectal discomfort  Other orders -     hydrocortisone 2.5 % cream; Apply topically 2 (two) times daily. -     Misc. Devices (SITZ BATH) MISC; 1 each by Does not apply route daily as needed. -     hydrocortisone (ANUSOL-HC) 25 MG suppository; Place 1 suppository (25 mg total) rectally 2 (two) times daily.

## 2019-05-08 NOTE — Patient Instructions (Signed)
Hemorrhoids:  I prescribed Hydrocortisone 2.5% cream today.  You can apply this topically to the external hemorrhoid twice daily short term for 3-5 days at a time as needed for itching, irritations, or swelling  I prescribed Hydrocortisone suppository (Anusol or Proctosol brand) today. You can use this twice daily per rectum short term for 3-5 days at a time for hemorrhoid flare up including itching, irritations, swelling or discomfort  Please review the information below  If not improving in the next few days or worsening, then call or recheck.  Medication costs:  If you get to the pharmacy and medication prescribed today was either too expensive, not covered by your insurance, or required prior authorization, then please call us back to let us know.  We often have no way to know if a medication is too expensive or not covered by your insurance.  Thanks for your cooperation.    Anal Fissure, Adult  An anal fissure is a small tear or crack in the tissue around the opening of the butt (anus). Bleeding from the tear or crack usually stops on its own within a few minutes. The bleeding may happen every time you poop (have a bowel movement) until the tear or crack heals. What are the causes? This condition is usually caused by passing a large or hard poop (stool). Other causes include: Trouble pooping (constipation). Passing watery poop (diarrhea). Inflammatory bowel disease (Crohn's disease or ulcerative colitis). Childbirth. Infections. Anal sex. What are the signs or symptoms? Symptoms of this condition include: Bleeding from the butt. Small amounts of blood on your poop. The blood coats the outside of the poop. It is not mixed with the poop. Small amounts of blood on the toilet paper or in the toilet after you poop. Pain when passing poop. Itching or irritation around the opening of the butt. How is this diagnosed? This condition may be diagnosed based on a physical exam. Your doctor  may: Check your butt. A tear can often be seen by checking the area with care. Check your butt using a short tube (anoscope). The light in the tube will show any problems in your butt. How is this treated? Treatment for this condition may include: Treating problems that make it hard for you to pass poop. You may be told to: Eat more fiber. Drink more fluid. Take fiber supplements. Take medicines that make poop soft. Taking sitz baths. This may help to heal the tear. Using creams and ointments. If your condition gets worse, other treatments may be needed such as: A shot near the tear or crack (botulinum injection). Surgery to repair the tear or crack. Follow these instructions at home: Eating and drinking  Avoid bananas and dairy products. These foods can make it hard to poop. Drink enough fluid to keep your pee (urine) pale yellow. Eat foods that have a lot of fiber in them, such as: Beans. Whole grains. Fresh fruits. Fresh vegetables. General instructions  Take over-the-counter and prescription medicines only as told by your doctor. Use creams or ointments only as told by your doctor. Keep the butt area as clean and dry as you can. Take a warm water bath (sitz bath) as told by your doctor. Do not use soap. Keep all follow-up visits as told by your doctor. This is important. Contact a doctor if: You have more bleeding. You have a fever. You have watery poop that is mixed with blood. You have pain. Your problem gets worse, not better. Summary An anal fissure  is a small tear or crack in the skin around the opening of the butt (anus). This condition is usually caused by passing a large or hard poop (stool). Treatment includes treating the problems that make it hard for you to pass poop. Follow your doctor's instructions about caring for your condition at home. Keep all follow-up visits as told by your doctor. This is important. This information is not intended to replace  advice given to you by your health care provider. Make sure you discuss any questions you have with your health care provider. Document Revised: 08/10/2017 Document Reviewed: 08/10/2017 Elsevier Patient Education  2020 [employer].       Hemorrhoids Hemorrhoids are swollen veins in and around the rectum or anus. There are two types of hemorrhoids:  Internal hemorrhoids. These occur in the veins that are just inside the rectum. They may poke through to the outside and become irritated and painful.  External hemorrhoids. These occur in the veins that are outside of the anus and can be felt as a painful swelling or hard lump near the anus.  Most hemorrhoids do not cause serious problems, and they can be managed with home treatments such as diet and lifestyle changes. If home treatments do not help your symptoms, procedures can be done to shrink or remove the hemorrhoids. What are the causes? This condition is caused by increased pressure in the anal area. This pressure may result from various things, including:  Constipation.  Straining to have a bowel movement.  Diarrhea.  Pregnancy.  Obesity.  Sitting for long periods of time.  Heavy lifting or other activity that causes you to strain.  Anal sex.  What are the signs or symptoms? Symptoms of this condition include:  Pain.  Anal itching or irritation.  Rectal bleeding.  Leakage of stool (feces).  Anal swelling.  One or more lumps around the anus.  How is this diagnosed? This condition can often be diagnosed through a visual exam. Other exams or tests may also be done, such as:  Examination of the rectal area with a gloved hand (digital rectal exam).  Examination of the anal canal using a small tube (anoscope).  A blood test, if you have lost a significant amount of blood.  A test to look inside the colon (sigmoidoscopy or colonoscopy).  How is this treated? This condition can usually be treated at home.  However, various procedures may be done if dietary changes, lifestyle changes, and other home treatments do not help your symptoms. These procedures can help make the hemorrhoids smaller or remove them completely. Some of these procedures involve surgery, and others do not. Common procedures include:  Rubber band ligation. Rubber bands are placed at the base of the hemorrhoids to cut off the blood supply to them.  Sclerotherapy. Medicine is injected into the hemorrhoids to shrink them.  Infrared coagulation. A type of light energy is used to get rid of the hemorrhoids.  Hemorrhoidectomy surgery. The hemorrhoids are surgically removed, and the veins that supply them are tied off.  Stapled hemorrhoidopexy surgery. A circular stapling device is used to remove the hemorrhoids and use staples to cut off the blood supply to them.  Follow these instructions at home: Eating and drinking  Eat foods that have a lot of fiber in them, such as whole grains, beans, nuts, fruits, and vegetables. Ask your health care provider about taking products that have added fiber (fiber supplements).  Drink enough fluid to keep your urine clear or  pale yellow. Managing pain and swelling  Take warm sitz baths for 20 minutes, 3-4 times a day to ease pain and discomfort.  If directed, apply ice to the affected area. Using ice packs between sitz baths may be helpful. ? Put ice in a plastic bag. ? Place a towel between your skin and the bag. ? Leave the ice on for 20 minutes, 2-3 times a day. General instructions  Take over-the-counter and prescription medicines only as told by your health care provider.  Use medicated creams or suppositories as told.  Exercise regularly.  Go to the bathroom when you have the urge to have a bowel movement. Do not wait.  Avoid straining to have bowel movements.  Keep the anal area dry and clean. Use wet toilet paper or moist towelettes after a bowel movement.  Do not sit on  the toilet for long periods of time. This increases blood pooling and pain. Contact a health care provider if:  You have increasing pain and swelling that are not controlled by treatment or medicine.  You have uncontrolled bleeding.  You have difficulty having a bowel movement, or you are unable to have a bowel movement.  You have pain or inflammation outside the area of the hemorrhoids. This information is not intended to replace advice given to you by your health care provider. Make sure you discuss any questions you have with your health care provider. Document Released: 02/26/2000 Document Revised: 07/29/2015 Document Reviewed: 11/12/2014 Elsevier Interactive Patient Education  [employer].

## 2019-05-31 ENCOUNTER — Ambulatory Visit: Payer: Federal, State, Local not specified - PPO | Attending: Internal Medicine

## 2019-05-31 DIAGNOSIS — Z23 Encounter for immunization: Secondary | ICD-10-CM

## 2019-05-31 NOTE — Progress Notes (Signed)
   Covid-19 Vaccination Clinic  Name:  Christopher Gay    MRN: [id number] DOB: [birth year]  05/31/2019  [name] was observed post Covid-19 immunization for 15 minutes without incident. He was provided with Vaccine Information Sheet and instruction to access the V-Safe system.   [name] was instructed to call 911 with any severe reactions post vaccine: Marland Kitchen Difficulty breathing  . Swelling of face and throat  . A fast heartbeat  . A bad rash all over body  . Dizziness and weakness   Immunizations Administered    Name Date Dose VIS Date Route   Pfizer COVID-19 Vaccine 05/31/2019  1:10 PM 0.3 mL 02/22/2019 Intramuscular   Manufacturer: [employer], [employer]   Lot: [id number]   NDC: [address]-7

## 2019-06-26 ENCOUNTER — Ambulatory Visit: Payer: Federal, State, Local not specified - PPO | Attending: Internal Medicine

## 2019-06-26 ENCOUNTER — Ambulatory Visit: Payer: Federal, State, Local not specified - PPO

## 2019-06-26 DIAGNOSIS — Z23 Encounter for immunization: Secondary | ICD-10-CM

## 2019-06-26 NOTE — Progress Notes (Signed)
   Covid-19 Vaccination Clinic  Name:  Christopher Gay    MRN: [id number] DOB: [birth year]  06/26/2019  [name] was observed post Covid-19 immunization for 15 minutes without incident. He was provided with Vaccine Information Sheet and instruction to access the V-Safe system.   [name] was instructed to call 911 with any severe reactions post vaccine: Marland Kitchen Difficulty breathing  . Swelling of face and throat  . A fast heartbeat  . A bad rash all over body  . Dizziness and weakness   Immunizations Administered    Name Date Dose VIS Date Route   Pfizer COVID-19 Vaccine 06/26/2019  8:28 AM 0.3 mL 02/22/2019 Intramuscular   Manufacturer: [employer], [employer]   Lot: [id number]   NDC: [address]-1

## 2019-07-15 ENCOUNTER — Encounter: Payer: Federal, State, Local not specified - PPO | Admitting: Medical

## 2019-08-31 ENCOUNTER — Other Ambulatory Visit: Payer: Self-pay | Admitting: Medical

## 2019-11-18 ENCOUNTER — Other Ambulatory Visit: Payer: Self-pay

## 2019-11-18 ENCOUNTER — Ambulatory Visit (HOSPITAL_COMMUNITY)
Admission: EM | Admit: 2019-11-18 | Discharge: 2019-11-18 | Disposition: A | Discharge status: Home / Self Care | Payer: Federal, State, Local not specified - PPO | Attending: Internal Medicine | Admitting: Internal Medicine

## 2019-11-18 ENCOUNTER — Encounter (HOSPITAL_COMMUNITY): Payer: Self-pay | Admitting: Emergency Medicine

## 2019-11-18 DIAGNOSIS — Z87438 Personal history of other diseases of male genital organs: Secondary | ICD-10-CM | POA: Diagnosis not present

## 2019-11-18 DIAGNOSIS — R102 Pelvic and perineal pain: Secondary | ICD-10-CM

## 2019-11-18 DIAGNOSIS — R300 Dysuria: Secondary | ICD-10-CM | POA: Insufficient documentation

## 2019-11-18 DIAGNOSIS — N400 Enlarged prostate without lower urinary tract symptoms: Secondary | ICD-10-CM

## 2019-11-18 DIAGNOSIS — M549 Dorsalgia, unspecified: Secondary | ICD-10-CM | POA: Diagnosis not present

## 2019-11-18 DIAGNOSIS — M79606 Pain in leg, unspecified: Secondary | ICD-10-CM | POA: Diagnosis not present

## 2019-11-18 DIAGNOSIS — K6289 Other specified diseases of anus and rectum: Secondary | ICD-10-CM | POA: Insufficient documentation

## 2019-11-18 LAB — POCT URINALYSIS DIPSTICK, ED / UC
Bilirubin Urine: NEGATIVE
Glucose, UA: NEGATIVE mg/dL
Hgb urine dipstick: NEGATIVE
Ketones, ur: NEGATIVE mg/dL
Leukocytes,Ua: NEGATIVE
Nitrite: NEGATIVE
Protein, ur: NEGATIVE mg/dL
Specific Gravity, Urine: 1.01 (ref 1.005–1.030)
Urobilinogen, UA: 0.2 mg/dL (ref 0.0–1.0)
pH: 7 (ref 5.0–8.0)

## 2019-11-18 MED ORDER — DOCUSATE SODIUM 100 MG PO CAPS: 100.0000 mg | ORAL_CAPSULE | Freq: Two times a day (BID) | ORAL | 0 refills | Status: DC

## 2019-11-18 NOTE — ED Triage Notes (Signed)
Pt presents to [facility] for assessment of difficulty urinating, bilateral leg aching, and lower back pain.  Patient states he has had these symptoms before with proctitis.  States he usually gets it once a year

## 2019-11-18 NOTE — ED Provider Notes (Signed)
[facility]   MRN: [id number] DOB: [birth year]  Subjective:   Christopher Gay is a 64 y.o. male presenting for several day history of recurrent pelvic and buttock pain, painful urination.  Patient is concerned about prostatitis.  Has a history of BPH.  Denies straining to urinate, weakened stream, hematuria.  States that he has had a history of kidney stone as well.  Has also been diagnosed with anal fissures in the past.  No current facility-administered medications for this encounter.  Current Outpatient Medications:  .  hydrocortisone (ANUSOL-HC) 25 MG suppository, Place 1 suppository (25 mg total) rectally 2 (two) times daily., Disp: 12 suppository, Rfl: 0 .  hydrocortisone 2.5 % cream, Apply topically 2 (two) times daily., Disp: 30 g, Rfl: 1 .  insulin degludec (TRESIBA FLEXTOUCH) 100 UNIT/ML SOPN FlexTouch Pen, Inject 0.25 mLs (25 Units total) into the skin daily at 10 pm., Disp: 6 mL, Rfl: 2 .  Insulin Pen Needle (BD PEN NEEDLE NANO U/F) 32G X 4 MM MISC, 1 each by Does not apply route at bedtime., Disp: 100 each, Rfl: 11 .  lidocaine (XYLOCAINE) 5 % ointment, Apply 1 application topically 3 (three) times daily as needed. (Patient not taking: Reported on 04/15/2019), Disp: 35.44 g, Rfl: 0 .  lisinopril (ZESTRIL) 10 MG tablet, TAKE 1 TABLET BY MOUTH EVERY DAY, Disp: 90 tablet, Rfl: 2 .  metFORMIN (GLUCOPHAGE) 850 MG tablet, Take 1 tablet (850 mg total) by mouth 2 (two) times daily with a meal., Disp: 180 tablet, Rfl: 1 .  Misc. Devices (SITZ BATH) MISC, 1 each by Does not apply route daily as needed., Disp: 1 each, Rfl: 1 .  nitroGLYCERIN (NITROGLYN) 2 % OINT ointment, Apply 1 application topically every 12 (twelve) hours. (Patient not taking: Reported on 04/15/2019), Disp: 30 g, Rfl: 0 .  rosuvastatin (CRESTOR) 20 MG tablet, TAKE 1 TABLET BY MOUTH EVERY DAY, Disp: 90 tablet, Rfl: 2   Allergies  Allergen Reactions  . Doxycycline     Past Medical History:  Diagnosis Date  .  Arthritis   . BPH without urinary obstruction   . Diabetes mellitus without complication (HCC) 2000   diagnosed in [place]  . Hyperlipidemia   . Hypertension     prior on medication year ago  . Kidney stone      Past Surgical History:  Procedure Laterality Date  . HERNIA REPAIR    . KIDNEY STONE SURGERY      Family History  Problem Relation Age of Onset  . Cancer Mother        breast  . Cancer Paternal Grandmother        colon    Social History   Tobacco Use  . Smoking status: Former Smoker    Types: Cigars  . Smokeless tobacco: Never Used  Substance Use Topics  . Alcohol use: Yes    Comment: once a month  . Drug use: Never    ROS   Objective:   Vitals: BP 134/77 (BP Location: Left Arm)   Pulse 92   Temp 98.2 F (36.8 C) (Oral)   Resp 18   SpO2 98%   Physical Exam Constitutional:      General: He is not in acute distress.    Appearance: Normal appearance. He is well-developed. He is not ill-appearing, toxic-appearing or diaphoretic.  HENT:     Head: Normocephalic and atraumatic.     Right Ear: External ear normal.     Left Ear: External ear normal.  Nose: Nose normal.     Mouth/Throat:     Mouth: Mucous membranes are moist.     Pharynx: Oropharynx is clear.  Eyes:     General: No scleral icterus.       Right eye: No discharge.        Left eye: No discharge.     Extraocular Movements: Extraocular movements intact.     Conjunctiva/sclera: Conjunctivae normal.     Pupils: Pupils are equal, round, and reactive to light.  Cardiovascular:     Rate and Rhythm: Normal rate and regular rhythm.     Heart sounds: Normal heart sounds. No murmur heard.  No friction rub. No gallop.   Pulmonary:     Effort: Pulmonary effort is normal. No respiratory distress.     Breath sounds: Normal breath sounds. No stridor. No wheezing, rhonchi or rales.  Abdominal:     General: Bowel sounds are normal. There is no distension.     Palpations: Abdomen is soft.  There is no mass.     Tenderness: There is no abdominal tenderness. There is no right CVA tenderness, left CVA tenderness, guarding or rebound.  Genitourinary:    Rectum: No tenderness, anal fissure or external hemorrhoid.  Musculoskeletal:     Lumbar back: No swelling, edema, deformity, signs of trauma, lacerations, spasms, tenderness or bony tenderness. Normal range of motion. Negative right straight leg raise test and negative left straight leg raise test. No scoliosis.  Skin:    General: Skin is warm and dry.  Neurological:     Mental Status: He is alert and oriented to person, place, and time.     Motor: No weakness.     Coordination: Coordination normal.     Gait: Gait normal.     Deep Tendon Reflexes: Reflexes normal.  Psychiatric:        Mood and Affect: Mood normal.        Behavior: Behavior normal.        Thought Content: Thought content normal.        Judgment: Judgment normal.     Results for orders placed or performed during the hospital encounter of 11/18/19 (from the past 24 hour(s))  POCT Urinalysis Dipstick (ED/UC)     Status: None   Collection Time: 11/18/19  4:16 PM  Result Value Ref Range   Glucose, UA NEGATIVE NEGATIVE mg/dL   Bilirubin Urine NEGATIVE NEGATIVE   Ketones, ur NEGATIVE NEGATIVE mg/dL   Specific Gravity, Urine 1.010 1.005 - 1.030   Hgb urine dipstick NEGATIVE NEGATIVE   pH 7.0 5.0 - 8.0   Protein, ur NEGATIVE NEGATIVE mg/dL   Urobilinogen, UA 0.2 0.0 - 1.0 mg/dL   Nitrite NEGATIVE NEGATIVE   Leukocytes,Ua NEGATIVE NEGATIVE    Assessment and Plan :   PDMP not reviewed this encounter.  1. Dysuria   2. Perianal pain   3. Pelvic pain in male   4. History of prostatitis   5. Benign prostatic hyperplasia, unspecified whether lower urinary tract symptoms present     At the end of his visit, we 109 constipation as a source of his symptoms.  Patient states that he has had more recurrent difficulty with constipation lately.  Would like to trial  stool softener.  States that he is already had his colonoscopy and was only found to have a polyp.  Urine culture pending.  [facility] with [facility] and [facility]. Counseled patient on potential for adverse effects with medications prescribed/recommended today, ER and  return-to-clinic precautions discussed, patient verbalized understanding.    Wallis Bamberg, PA-C 11/18/19 [id number]

## 2019-11-19 LAB — URINE CULTURE: Culture: NO GROWTH

## 2019-12-01 ENCOUNTER — Other Ambulatory Visit: Payer: Self-pay | Admitting: Medical

## 2019-12-09 ENCOUNTER — Ambulatory Visit: Payer: Federal, State, Local not specified - PPO | Admitting: Medical

## 2019-12-23 ENCOUNTER — Telehealth: Payer: Self-pay | Admitting: Medical

## 2019-12-23 ENCOUNTER — Ambulatory Visit: Payer: Federal, State, Local not specified - PPO | Admitting: Medical

## 2019-12-23 ENCOUNTER — Other Ambulatory Visit: Payer: Self-pay | Admitting: Medical

## 2019-12-23 ENCOUNTER — Encounter: Payer: Self-pay | Admitting: Medical

## 2019-12-23 ENCOUNTER — Other Ambulatory Visit: Payer: Self-pay

## 2019-12-23 VITALS — BP 140/84 | HR 87 | Ht 71.0 in | Wt 189.0 lb

## 2019-12-23 DIAGNOSIS — Z0000 Encounter for general adult medical examination without abnormal findings: Secondary | ICD-10-CM | POA: Diagnosis not present

## 2019-12-23 DIAGNOSIS — E1159 Type 2 diabetes mellitus with other circulatory complications: Secondary | ICD-10-CM

## 2019-12-23 DIAGNOSIS — E1165 Type 2 diabetes mellitus with hyperglycemia: Secondary | ICD-10-CM

## 2019-12-23 DIAGNOSIS — E785 Hyperlipidemia, unspecified: Secondary | ICD-10-CM | POA: Diagnosis not present

## 2019-12-23 DIAGNOSIS — Z125 Encounter for screening for malignant neoplasm of prostate: Secondary | ICD-10-CM

## 2019-12-23 DIAGNOSIS — Z23 Encounter for immunization: Secondary | ICD-10-CM | POA: Insufficient documentation

## 2019-12-23 DIAGNOSIS — L602 Onychogryphosis: Secondary | ICD-10-CM

## 2019-12-23 DIAGNOSIS — Z1159 Encounter for screening for other viral diseases: Secondary | ICD-10-CM | POA: Insufficient documentation

## 2019-12-23 DIAGNOSIS — I152 Hypertension secondary to endocrine disorders: Secondary | ICD-10-CM

## 2019-12-23 DIAGNOSIS — Z7185 Encounter for immunization safety counseling: Secondary | ICD-10-CM

## 2019-12-23 DIAGNOSIS — R899 Unspecified abnormal finding in specimens from other organs, systems and tissues: Secondary | ICD-10-CM | POA: Diagnosis not present

## 2019-12-23 NOTE — Telephone Encounter (Signed)
Pt is requesting a refill for his metformin [id number] [place], [place] - [address]

## 2019-12-23 NOTE — Progress Notes (Signed)
Done

## 2019-12-23 NOTE — Progress Notes (Signed)
Complete physical exam   Patient: Christopher Gay   DOB: February 26, 1956   64 y.o. Male  MRN: [id number] Visit Date: 12/23/2019  Today's healthcare provider: Kristian Covey, PA-C   Chief Complaint  Patient presents with  . Annual Exam    with fasting labs   I,Shane Rockne Dearinger,acting as a scribe for [employer], PA-C.,have documented all relevant documentation on the behalf of Kristian Covey, PA-C,as directed by  [employer], PA-C while in the presence of [employer], PA-C.  Subjective    Christopher Gay is a 64 y.o. male who presents today for a complete physical exam.   HPI HPI    Annual Exam     Additional comments: with fasting labs        Last edited by Victorio Palm, CMA on 12/23/2019 10:14 AM. (History)      Diabetes Patient is currently checking blood sugars and reports the reading have been fluctuating.  He is on the road traveling a lot delivering tractors.  He actually ran out of [place] recently as he had to dispose of some that was at room temperature while on the road.  He has been taking the Metformin without disruption no  Hypertension Patient reports not currently checking his blood pressure.  He reports compliance of medication  Hyperlipidemia-compliant with Crestor daily  No other new complaint  Patient had a colonoscopy at [facility] on 05/03/2018  Past Medical History:  Diagnosis Date  . Arthritis   . BPH without urinary obstruction   . Diabetes mellitus without complication (HCC) 2000   diagnosed in [place]  . Hyperlipidemia   . Hypertension     prior on medication year ago  . Kidney stone    Past Surgical History:  Procedure Laterality Date  . HERNIA REPAIR    . KIDNEY STONE SURGERY     Social History   Socioeconomic History  . Marital status: Married    Spouse name: Not on file  . Number of children: Not on file  . Years of education: Not on file  . Highest education level: Not on file  Occupational History   . Not on file  Tobacco Use  . Smoking status: Former Smoker    Types: Cigars  . Smokeless tobacco: Never Used  Substance and Sexual Activity  . Alcohol use: Yes    Comment: once a month  . Drug use: Never  . Sexual activity: Not on file  Other Topics Concern  . Not on file  Social History Narrative  . Not on file   Social Determinants of Health   Financial Resource Strain:   . Difficulty of Paying Living Expenses: Not on file  Food Insecurity:   . Worried About Programme researcher, broadcasting/film/video in the Last Year: Not on file  . Ran Out of Food in the Last Year: Not on file  Transportation Needs:   . Lack of Transportation (Medical): Not on file  . Lack of Transportation (Non-Medical): Not on file  Physical Activity:   . Days of Exercise per Week: Not on file  . Minutes of Exercise per Session: Not on file  Stress:   . Feeling of Stress : Not on file  Social Connections:   . Frequency of Communication with Friends and Family: Not on file  . Frequency of Social Gatherings with Friends and Family: Not on file  . Attends Religious Services: Not on file  . Active Member of Clubs or Organizations: Not on file  .  Attends BankerClub or Organization Meetings: Not on file  . Marital Status: Not on file  Intimate Partner Violence:   . Fear of Current or Ex-Partner: Not on file  . Emotionally Abused: Not on file  . Physically Abused: Not on file  . Sexually Abused: Not on file   Family Status  Relation Name Status  . Mother  (Not Specified)  . PGM  (Not Specified)   Family History  Problem Relation Age of Onset  . Cancer Mother        breast  . Cancer Paternal Grandmother        colon   Allergies  Allergen Reactions  . Doxycycline     Patient Care Team: Alaylah Heatherington, Kermit Baloavid S, PA-C as PCP - General (Family Medicine)   Medications: Outpatient Medications Prior to Visit  Medication Sig  . insulin degludec (TRESIBA FLEXTOUCH) 100 UNIT/ML SOPN FlexTouch Pen Inject 0.25 mLs (25 Units total)  into the skin daily at 10 pm.  . Insulin Pen Needle (BD PEN NEEDLE NANO U/F) 32G X 4 MM MISC 1 each by Does not apply route at bedtime.  Marland Kitchen. lisinopril (ZESTRIL) 10 MG tablet TAKE 1 TABLET BY MOUTH EVERY DAY  . metFORMIN (GLUCOPHAGE) 850 MG tablet TAKE 1 TABLET (850 MG TOTAL) BY MOUTH 2 (TWO) TIMES DAILY WITH A MEAL.  . rosuvastatin (CRESTOR) 20 MG tablet TAKE 1 TABLET BY MOUTH EVERY DAY  . docusate sodium (COLACE) 100 MG capsule Take 1 capsule (100 mg total) by mouth every 12 (twelve) hours. (Patient not taking: Reported on 12/23/2019)  . hydrocortisone (ANUSOL-HC) 25 MG suppository Place 1 suppository (25 mg total) rectally 2 (two) times daily. (Patient not taking: Reported on 12/23/2019)  . hydrocortisone 2.5 % cream Apply topically 2 (two) times daily. (Patient not taking: Reported on 12/23/2019)  . lidocaine (XYLOCAINE) 5 % ointment Apply 1 application topically 3 (three) times daily as needed. (Patient not taking: Reported on 04/15/2019)  . Misc. Devices (SITZ BATH) MISC 1 each by Does not apply route daily as needed. (Patient not taking: Reported on 12/23/2019)  . nitroGLYCERIN (NITROGLYN) 2 % OINT ointment Apply 1 application topically every 12 (twelve) hours. (Patient not taking: Reported on 04/15/2019)   No facility-administered medications prior to visit.    Review of Systems Review of Systems Constitutional: -fever, -chills, -sweats, -unexpected weight change, -anorexia, -fatigue Allergy: -sneezing, -itching, -congestion Dermatology: denies changing moles, rash, lumps, new worrisome lesions ENT: -runny nose, -ear pain, -sore throat, -hoarseness, -sinus pain, -teeth pain, -tinnitus, -hearing loss, -epistaxis Cardiology:  -chest pain, -palpitations, -edema, -orthopnea, -paroxysmal nocturnal dyspnea Respiratory: -cough, -shortness of breath, -dyspnea on exertion, -wheezing, -hemoptysis Gastroenterology: -abdominal pain, -nausea, -vomiting, -diarrhea, -constipation, -blood in stool, -changes  in bowel movement, -dysphagia Hematology: -bleeding or bruising problems Musculoskeletal: -arthralgias, -myalgias, -joint swelling, -back pain, -neck pain, -cramping, -gait changes Ophthalmology: -vision changes, -eye redness, -itching, -discharge Urology: -dysuria, -difficulty urinating, -hematuria, -urinary frequency, -urgency, incontinence Neurology: -headache, -weakness, -tingling, -numbness, -speech abnormality, -memory loss, -falls, -dizziness Psychology:  -depressed mood, -agitation, -sleep problems     Objective    BP 140/84   Pulse 87   Ht 5\' 11"  (1.803 m)   Wt 189 lb (85.7 kg)   SpO2 98%   BMI 26.36 kg/m   BP Readings from Last 3 Encounters:  12/23/19 140/84  11/18/19 134/77  05/08/19 130/76   Wt Readings from Last 3 Encounters:  12/23/19 189 lb (85.7 kg)  05/08/19 190 lb (86.2 kg)  04/15/19 191 lb (86.6 kg)  General appearence: alert, no distress, WD/WN, white male HEENT: normocephalic, sclerae anicteric, PERRLA, EOMi Oral cavity: MMM, no lesions Neck: supple, no lymphadenopathy, no thyromegaly, no masses, no bruits Heart: RRR, normal S1, S2, no murmurs Lungs: CTA bilaterally, no wheezes, rhonchi, or rales Abdomen: +bs, soft, non tender, non distended, no masses, no hepatomegaly, no splenomegaly Back: non tender Musculoskeletal: nontender, no swelling, no obvious deformity Extremities: no edema, no cyanosis, no clubbing Pulses: 2+ symmetric, upper and lower extremities, normal cap refill Neurological: alert, oriented x 3, CN2-12 intact, strength normal upper extremities and lower extremities, sensation normal throughout, DTRs 2+ throughout, no cerebellar signs, gait normal Psychiatric: normal affect, behavior normal, pleasant  GU: Normal male, circumcised, no mass no hernia Rectal: Normal anal tone, prostate mildly enlarged, no nodules   Diabetic Foot Exam - Simple   Simple Foot Form Diabetic Foot exam was performed with the following findings: Yes  12/23/2019 11:01 AM  Visual Inspection See comments: Yes Sensation Testing Intact to touch and monofilament testing bilaterally: Yes Pulse Check Posterior Tibialis and Dorsalis pulse intact bilaterally: Yes Comments Thickened toenails throughout, some brownish coloration of several toenails including the great toenails, callus of left medial great toe at the MTP, no other lesions       Last depression screening scores PHQ 2/9 Scores 12/23/2019 10/10/2018 09/13/2017  PHQ - 2 Score 0 0 0   Last fall risk screening Fall Risk  09/13/2017  Falls in the past year? No       Assessment & Plan   Encounter Diagnoses  Name Primary?  . Encounter for health maintenance examination in adult Yes  . Uncontrolled type 2 diabetes mellitus with hyperglycemia (HCC)   . Hypertension associated with type 2 diabetes mellitus (HCC)   . Hyperlipidemia, unspecified hyperlipidemia type   . Vaccine counseling   . Screening for prostate cancer   . Need for vaccination   . Encounter for hepatitis C screening test for low risk patient   . Hypertrophic toenail        Routine Health Maintenance and Physical Exam  Exercise Activities and Dietary recommendations Goals   None     Immunization History  Administered Date(s) Administered  . Influenza Inj Mdck Quad Pf 11/23/2016  . Influenza,inj,Quad PF,6+ Mos 12/19/2017, 11/20/2018, 11/12/2019  . PFIZER SARS-COV-2 Vaccination 05/31/2019, 06/26/2019  . Pneumococcal Polysaccharide-23 06/21/2017  . Tdap 04/04/2018  . Zoster Recombinat (Shingrix) 04/04/2018, 06/06/2018    Health Maintenance  Topic Date Due  . Hepatitis C Screening  Never done  . HIV Screening  Never done  . COLONOSCOPY  Never done  . OPHTHALMOLOGY EXAM  03/27/2019  . HEMOGLOBIN A1C  10/13/2019  . FOOT EXAM  12/22/2020  . TETANUS/TDAP  04/04/2028  . INFLUENZA VACCINE  Completed  . COVID-19 Vaccine  Completed    Physical exam - discussed and counseled on healthy lifestyle,  diet, exercise, preventative care, vaccinations, sick and well care, proper use of emergency dept and after hours care, and addressed their concerns.    Health screening: See your eye doctor yearly for routine vision care. See your dentist yearly for routine dental care including hygiene visits twice yearly.   Cancer screening Colonoscopy:  We will get a copy of the colonoscopy report from [facility]  Discussed PSA, prostate exam, and prostate cancer screening risks/benefits.      Vaccinations: Counseled on the Covid booster virus vaccine.  Vaccine information sheet given. Covid booster vaccine given after consent obtained.  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  Diagnoses and all orders for this visit:  Encounter for health maintenance examination in adult -     CBC with Differential/Platelet -     Comprehensive metabolic panel -     Hemoglobin A1c -     Lipid panel -     Microalbumin/Creatinine Ratio, Urine -     Hepatitis C antibody -     HIV Antibody (routine testing w rflx)  Uncontrolled type 2 diabetes mellitus with hyperglycemia (HCC) -     Comprehensive metabolic panel -     Hemoglobin A1c -     Ambulatory referral to Podiatry  Hypertension associated with type 2 diabetes mellitus (HCC) -     Microalbumin/Creatinine Ratio, Urine  Hyperlipidemia, unspecified hyperlipidemia type  Vaccine counseling  Screening for prostate cancer  Need for vaccination  Encounter for hepatitis C  screening test for low risk patient -     Hepatitis C antibody -     HIV Antibody (routine testing w rflx)  Hypertrophic toenail -     Ambulatory referral to Podiatry    Follow-up pending labs, yearly for physical          Kristian Covey, Cordelia Poche  [facility] [phone] (phone) [phone] (fax)  [facility]

## 2019-12-23 NOTE — Addendum Note (Signed)
Addended by: Victorio Palm on: 12/23/2019 11:36 AM   Modules accepted: Orders

## 2019-12-24 ENCOUNTER — Other Ambulatory Visit: Payer: Self-pay | Admitting: Medical

## 2019-12-24 LAB — CBC WITH DIFFERENTIAL/PLATELET
Basophils Absolute: 0 10*3/uL (ref 0.0–0.2)
Basos: 1 %
EOS (ABSOLUTE): 0.2 10*3/uL (ref 0.0–0.4)
Eos: 2 %
Hematocrit: 40.1 % (ref 37.5–51.0)
Hemoglobin: 14 g/dL (ref 13.0–17.7)
Immature Grans (Abs): 0 10*3/uL (ref 0.0–0.1)
Immature Granulocytes: 0 %
Lymphocytes Absolute: 2 10*3/uL (ref 0.7–3.1)
Lymphs: 28 %
MCH: 31.3 pg (ref 26.6–33.0)
MCHC: 34.9 g/dL (ref 31.5–35.7)
MCV: 90 fL (ref 79–97)
Monocytes Absolute: 0.6 10*3/uL (ref 0.1–0.9)
Monocytes: 8 %
Neutrophils Absolute: 4.3 10*3/uL (ref 1.4–7.0)
Neutrophils: 61 %
Platelets: 245 10*3/uL (ref 150–450)
RBC: 4.47 x10E6/uL (ref 4.14–5.80)
RDW: 12.5 % (ref 11.6–15.4)
WBC: 7.1 10*3/uL (ref 3.4–10.8)

## 2019-12-24 LAB — LIPID PANEL
Chol/HDL Ratio: 3.8 ratio (ref 0.0–5.0)
Cholesterol, Total: 149 mg/dL (ref 100–199)
HDL: 39 mg/dL — ABNORMAL LOW (ref >39)
LDL Chol Calc (NIH): 80 mg/dL (ref 0–99)
Triglycerides: 173 mg/dL — ABNORMAL HIGH (ref 0–149)
VLDL Cholesterol Cal: 30 mg/dL (ref 5–40)

## 2019-12-24 LAB — COMPREHENSIVE METABOLIC PANEL
ALT: 21 IU/L (ref 0–44)
AST: 18 IU/L (ref 0–40)
Albumin/Globulin Ratio: 2.3 — ABNORMAL HIGH (ref 1.2–2.2)
Albumin: 5 g/dL — ABNORMAL HIGH (ref 3.8–4.8)
Alkaline Phosphatase: 64 IU/L (ref 44–121)
BUN/Creatinine Ratio: 15 (ref 10–24)
BUN: 12 mg/dL (ref 8–27)
Bilirubin Total: 0.6 mg/dL (ref 0.0–1.2)
CO2: 22 mmol/L (ref 20–29)
Calcium: 9.9 mg/dL (ref 8.6–10.2)
Chloride: 98 mmol/L (ref 96–106)
Creatinine, Ser: 0.82 mg/dL (ref 0.76–1.27)
GFR calc Af Amer: 108 mL/min/{1.73_m2} (ref >59)
GFR calc non Af Amer: 93 mL/min/{1.73_m2} (ref >59)
Globulin, Total: 2.2 g/dL (ref 1.5–4.5)
Glucose: 224 mg/dL — ABNORMAL HIGH (ref 65–99)
Potassium: 4.5 mmol/L (ref 3.5–5.2)
Sodium: 136 mmol/L (ref 134–144)
Total Protein: 7.2 g/dL (ref 6.0–8.5)

## 2019-12-24 LAB — HEMOGLOBIN A1C
Est. average glucose Bld gHb Est-mCnc: 203 mg/dL
Hgb A1c MFr Bld: 8.7 % — ABNORMAL HIGH (ref 4.8–5.6)

## 2019-12-24 LAB — MICROALBUMIN / CREATININE URINE RATIO
Creatinine, Urine: 38.3 mg/dL
Microalb/Creat Ratio: 223 mg/g creat — ABNORMAL HIGH (ref 0–29)
Microalbumin, Urine: 85.4 ug/mL

## 2019-12-24 LAB — HIV ANTIBODY (ROUTINE TESTING W REFLEX): HIV Screen 4th Generation wRfx: NONREACTIVE

## 2019-12-24 LAB — HEPATITIS C ANTIBODY: Hep C Virus Ab: <0.1 s/co ratio (ref 0.0–0.9)

## 2019-12-24 MED ORDER — BD PEN NEEDLE NANO U/F 32G X 4 MM MISC: 1.0000 | Freq: Every day | 11 refills | Status: DC

## 2019-12-27 LAB — PROTEIN ELECTROPHORESIS
A/G Ratio: 1.4 (ref 0.7–1.7)
Albumin ELP: 4.3 g/dL (ref 2.9–4.4)
Alpha 1: 0.2 g/dL (ref 0.0–0.4)
Alpha 2: 0.7 g/dL (ref 0.4–1.0)
Beta: 1.3 g/dL (ref 0.7–1.3)
Gamma Globulin: 0.9 g/dL (ref 0.4–1.8)
Globulin, Total: 3.1 g/dL (ref 2.2–3.9)
Total Protein: 7.4 g/dL (ref 6.0–8.5)

## 2019-12-27 LAB — SPECIMEN STATUS REPORT

## 2020-01-07 ENCOUNTER — Telehealth: Payer: Self-pay | Admitting: Medical

## 2020-01-07 NOTE — Telephone Encounter (Signed)
Received requested records form [facility]

## 2020-01-17 ENCOUNTER — Encounter: Payer: Self-pay | Admitting: Medical

## 2020-01-21 ENCOUNTER — Encounter: Payer: Self-pay | Admitting: Medical

## 2020-02-17 ENCOUNTER — Ambulatory Visit: Payer: Federal, State, Local not specified - PPO | Admitting: Podiatry

## 2020-03-16 ENCOUNTER — Telehealth: Payer: Self-pay

## 2020-03-16 ENCOUNTER — Other Ambulatory Visit: Payer: Self-pay

## 2020-03-16 MED ORDER — METFORMIN HCL 850 MG PO TABS
850.0000 mg | ORAL_TABLET | Freq: Two times a day (BID) | ORAL | 0 refills | Status: DC
Start: 2020-03-16 — End: 2020-06-08

## 2020-03-16 NOTE — Telephone Encounter (Signed)
Sent!

## 2020-03-16 NOTE — Telephone Encounter (Signed)
Pt. Called stating he [facility] a [facility] apt[facility] 04/15/20 last apt 12/23/19.

## 2020-03-24 ENCOUNTER — Ambulatory Visit: Payer: Federal, State, Local not specified - PPO | Admitting: Medical

## 2020-04-15 ENCOUNTER — Ambulatory Visit: Payer: Federal, State, Local not specified - PPO | Admitting: Medical

## 2020-04-15 ENCOUNTER — Other Ambulatory Visit: Payer: Self-pay

## 2020-04-15 ENCOUNTER — Encounter: Payer: Self-pay | Admitting: Medical

## 2020-04-15 ENCOUNTER — Other Ambulatory Visit: Payer: Self-pay | Admitting: Medical

## 2020-04-15 VITALS — BP 150/84 | HR 76 | Ht 71.0 in | Wt 195.2 lb

## 2020-04-15 DIAGNOSIS — E1165 Type 2 diabetes mellitus with hyperglycemia: Secondary | ICD-10-CM | POA: Diagnosis not present

## 2020-04-15 DIAGNOSIS — E1159 Type 2 diabetes mellitus with other circulatory complications: Secondary | ICD-10-CM | POA: Diagnosis not present

## 2020-04-15 DIAGNOSIS — I152 Hypertension secondary to endocrine disorders: Secondary | ICD-10-CM

## 2020-04-15 DIAGNOSIS — E785 Hyperlipidemia, unspecified: Secondary | ICD-10-CM

## 2020-04-15 DIAGNOSIS — B351 Tinea unguium: Secondary | ICD-10-CM

## 2020-04-15 DIAGNOSIS — R809 Proteinuria, unspecified: Secondary | ICD-10-CM

## 2020-04-15 DIAGNOSIS — E782 Mixed hyperlipidemia: Secondary | ICD-10-CM | POA: Diagnosis not present

## 2020-04-15 LAB — POCT URINALYSIS DIP (PROADVANTAGE DEVICE)
Bilirubin, UA: NEGATIVE
Blood, UA: NEGATIVE
Glucose, UA: NEGATIVE mg/dL
Ketones, POC UA: NEGATIVE mg/dL
Leukocytes, UA: NEGATIVE
Nitrite, UA: NEGATIVE
Specific Gravity, Urine: 1.015
Urobilinogen, Ur: 0.2
pH, UA: 6 (ref 5.0–8.0)

## 2020-04-15 MED ORDER — ROSUVASTATIN CALCIUM 20 MG PO TABS: 20.0000 mg | ORAL_TABLET | Freq: Every day | ORAL | 1 refills | Status: DC

## 2020-04-15 MED ORDER — LISINOPRIL 20 MG PO TABS: 20.0000 mg | ORAL_TABLET | Freq: Every day | ORAL | 0 refills | Status: DC

## 2020-04-15 NOTE — Progress Notes (Signed)
Subjective: Chief Complaint  Patient presents with  . Diabetes    With fasting labs. Needs diabetic eye exam done    Here for med check, diabetes follow-up  Diabetes-He is compliant with Metformin 850 mg twice daily, using 21 units of Tresiba daily.  Sugars have been running elevated as he was out west in [place] recently to see his parents and there was not good options for foods that he was not eating healthy as he would like.  So he notes his blood sugars have been fluctuating up and down.  No foot lesions of concern.  He is due for diabetic eye exam.  Hyperlipidemia-compliant with Crestor 20 mg daily without complaint  Hypertension-compliant with lisinopril 10 mg daily without complaint  No other new complaints   Past Medical History:  Diagnosis Date  . Arthritis   . BPH without urinary obstruction   . Diabetes mellitus without complication (HCC) 2000   diagnosed in [place]  . Hyperlipidemia   . Hypertension     prior on medication year ago  . Kidney stone    Current Outpatient Medications on File Prior to Visit  Medication Sig Dispense Refill  . Insulin Pen Needle (BD PEN NEEDLE NANO U/F) 32G X 4 MM MISC 1 each by Does not apply route at bedtime. 100 each 11  . metFORMIN (GLUCOPHAGE) 850 MG tablet Take 1 tablet (850 mg total) by mouth 2 (two) times daily with a meal. 180 tablet 0  . TRESIBA FLEXTOUCH 100 UNIT/ML FlexTouch Pen INJECT 0.25 MLS (25 UNITS TOTAL) INTO THE SKIN DAILY AT 10 PM. 3 mL 2   No current facility-administered medications on file prior to visit.   ROS as in subjective   Objective: BP (!) 150/84   Pulse 76   Ht 5\' 11"  (1.803 m)   Wt 195 lb 3.2 oz (88.5 kg)   SpO2 98%   BMI 27.22 kg/m   General appearence: alert, no distress, WD/WN,  Heart: RRR, normal S1, S2, no murmurs Lungs: CTA bilaterally, no wheezes, rhonchi, or rales Pulses: 2+ symmetric, upper and 1-2+ lower extremities, normal cap refill in hands but slightly decreased cap refill in  feet Psych: Pleasant, answers questions appropriately No lower extremity edema  Diabetic Foot Exam - Simple   Simple Foot Form Diabetic Foot exam was performed with the following findings: Yes 04/15/2020 12:38 PM  Visual Inspection See comments: Yes Sensation Testing Intact to touch and monofilament testing bilaterally: Yes Pulse Check See comments: Yes Comments 1+ pedal pulses, yellowish toenails throughout, thickened toenails on the left first and second toenails, no other lesions       Assessment: Encounter Diagnoses  Name Primary?  06/13/2020 Uncontrolled type 2 diabetes mellitus with hyperglycemia (HCC) Yes  . Mixed dyslipidemia   . Microalbuminuria   . Hypertension associated with type 2 diabetes mellitus (HCC)   . Hyperlipidemia, unspecified hyperlipidemia type   . Onychomycosis      Plan: Diabetes-counseled on diet, exercise, glucose monitoring, advised we can increase Marland Kitchen 2 units/week until getting to goal blood sugars fasting.  C/t metformin, Continue healthy diet.  Continue daily foot checks.  Hypertension-increase to Lisinopril 20mg  daily  hyperlipidemia-continue current medication  Microalbuminuria- increase ACE dose today, Urinalysis reviewed.  Consider imaging of kidneys.    onychomycosis - consider oral Lamisil.  He will let me know if agreeable.  Discussed risks/benefits of medicaiton   Christopher Gay was seen today for diabetes.  Diagnoses and all orders for this visit:  Uncontrolled type 2  diabetes mellitus with hyperglycemia (HCC) -     Hemoglobin A1c  Mixed dyslipidemia  Microalbuminuria -     Renal Function Panel -     POCT Urinalysis DIP (Proadvantage Device)  Hypertension associated with type 2 diabetes mellitus (HCC) -     Renal Function Panel  Hyperlipidemia, unspecified hyperlipidemia type  Onychomycosis  Other orders -     rosuvastatin (CRESTOR) 20 MG tablet; Take 1 tablet (20 mg total) by mouth daily. -     lisinopril (ZESTRIL) 20 MG  tablet; Take 1 tablet (20 mg total) by mouth daily.   F/u pending labs

## 2020-04-16 LAB — RENAL FUNCTION PANEL
Albumin: 4.8 g/dL (ref 3.8–4.8)
BUN/Creatinine Ratio: 16 (ref 10–24)
BUN: 11 mg/dL (ref 8–27)
CO2: 24 mmol/L (ref 20–29)
Calcium: 9.5 mg/dL (ref 8.6–10.2)
Chloride: 97 mmol/L (ref 96–106)
Creatinine, Ser: 0.68 mg/dL — ABNORMAL LOW (ref 0.76–1.27)
GFR calc Af Amer: 117 mL/min/{1.73_m2} (ref >59)
GFR calc non Af Amer: 101 mL/min/{1.73_m2} (ref >59)
Glucose: 262 mg/dL — ABNORMAL HIGH (ref 65–99)
Phosphorus: 3.6 mg/dL (ref 2.8–4.1)
Potassium: 4.7 mmol/L (ref 3.5–5.2)
Sodium: 134 mmol/L (ref 134–144)

## 2020-04-16 LAB — HEMOGLOBIN A1C
Est. average glucose Bld gHb Est-mCnc: 252 mg/dL
Hgb A1c MFr Bld: 10.4 % — ABNORMAL HIGH (ref 4.8–5.6)

## 2020-05-26 ENCOUNTER — Other Ambulatory Visit: Payer: Self-pay | Admitting: Medical

## 2020-06-08 ENCOUNTER — Ambulatory Visit: Payer: Federal, State, Local not specified - PPO | Admitting: Medical

## 2020-06-08 ENCOUNTER — Other Ambulatory Visit: Payer: Self-pay | Admitting: Medical

## 2020-06-08 NOTE — Progress Notes (Deleted)
Last hemoglobin A1c April 15, 2020 was 10.4%  microalbuminuai  Compliant with Tresiba 25 units daily, Metformin 850 twice daily  Compliant with lisinopril 20 mg daily  Compliant with Crestor 20 mg daily   Subjective: No chief complaint on file.  Here for med check, diabetes follow-up.  I saw him in February for med check.  At that time though his A1c had jumped up to 10.4%  After his last visit we increased his insulin to 25 units/day with recommendations to go up 2 units/day/week if sugars staying above goal of 130 fasting.   As February visit we increased lisinopril to 20 mg daily.  vax ok, lamisil?  Endo?  **   Diabetes-He is compliant with Metformin 850 mg twice daily, using 21 units of Tresiba daily.  Sugars have been running elevated as he was out west in [place] recently to see his parents and there was not good options for foods that he was not eating healthy as he would like.  So he notes his blood sugars have been fluctuating up and down.  No foot lesions of concern.  He is due for diabetic eye exam.  Hyperlipidemia-compliant with Crestor 20 mg daily without complaint  Hypertension-compliant with lisinopril 10 mg daily without complaint  No other new complaints   Past Medical History:  Diagnosis Date  . Arthritis   . BPH without urinary obstruction   . Diabetes mellitus without complication (HCC) 2000   diagnosed in [place]  . Hyperlipidemia   . Hypertension     prior on medication year ago  . Kidney stone    Current Outpatient Medications on File Prior to Visit  Medication Sig Dispense Refill  . Insulin Pen Needle (BD PEN NEEDLE NANO U/F) 32G X 4 MM MISC 1 each by Does not apply route at bedtime. 100 each 11  . lisinopril (ZESTRIL) 20 MG tablet Take 1 tablet (20 mg total) by mouth daily. 90 tablet 0  . metFORMIN (GLUCOPHAGE) 850 MG tablet Take 1 tablet (850 mg total) by mouth 2 (two) times daily with a meal. 180 tablet 0  . rosuvastatin (CRESTOR) 20  MG tablet Take 1 tablet (20 mg total) by mouth daily. 90 tablet 1  . TRESIBA FLEXTOUCH 100 UNIT/ML FlexTouch Pen INJECT 0.25 MLS (25 UNITS TOTAL) INTO THE SKIN DAILY AT 10 PM. 3 mL 2   No current facility-administered medications on file prior to visit.   ROS as in subjective   Objective: There were no vitals taken for this visit.  General appearence: alert, no distress, WD/WN,  Heart: RRR, normal S1, S2, no murmurs Lungs: CTA bilaterally, no wheezes, rhonchi, or rales Pulses: 2+ symmetric, upper and 1-2+ lower extremities, normal cap refill in hands but slightly decreased cap refill in feet Psych: Pleasant, answers questions appropriately No lower extremity edema  Diabetic Foot Exam - Simple   No data filed       Assessment: No diagnosis found.   Plan: Diabetes-counseled on diet, exercise, glucose monitoring, advised we can increase Tresiba 2 units/week until getting to goal blood sugars fasting.  C/t metformin, Continue healthy diet.  Continue daily foot checks.  Hypertension-increase to Lisinopril 20mg  daily  hyperlipidemia-continue current medication  Microalbuminuria- increase ACE dose today, Urinalysis reviewed.  Consider imaging of kidneys.    onychomycosis - consider oral Lamisil.  He will let me know if agreeable.  Discussed risks/benefits of medicaiton   There are no diagnoses linked to this encounter. F/u pending labs

## 2020-07-08 ENCOUNTER — Ambulatory Visit: Payer: Federal, State, Local not specified - PPO | Admitting: Medical

## 2020-07-11 ENCOUNTER — Other Ambulatory Visit: Payer: Self-pay | Admitting: Medical

## 2020-07-15 ENCOUNTER — Other Ambulatory Visit: Payer: Self-pay

## 2020-07-15 ENCOUNTER — Encounter: Payer: Self-pay | Admitting: Medical

## 2020-07-15 ENCOUNTER — Ambulatory Visit: Payer: Federal, State, Local not specified - PPO | Admitting: Medical

## 2020-07-15 VITALS — BP 110/70 | HR 76 | Resp 18 | Ht 71.0 in | Wt 185.4 lb

## 2020-07-15 DIAGNOSIS — E1159 Type 2 diabetes mellitus with other circulatory complications: Secondary | ICD-10-CM | POA: Diagnosis not present

## 2020-07-15 DIAGNOSIS — E785 Hyperlipidemia, unspecified: Secondary | ICD-10-CM

## 2020-07-15 DIAGNOSIS — Z23 Encounter for immunization: Secondary | ICD-10-CM | POA: Diagnosis not present

## 2020-07-15 DIAGNOSIS — E1165 Type 2 diabetes mellitus with hyperglycemia: Secondary | ICD-10-CM

## 2020-07-15 DIAGNOSIS — I152 Hypertension secondary to endocrine disorders: Secondary | ICD-10-CM

## 2020-07-15 NOTE — Patient Instructions (Signed)
For the next few weeks, try getting some other glucose numbers to compare   Check some readings 2 hours after a meal Check some readings at bedtime Check some readings just before lunch and dinner  Continue fasting glucose checks   Write these numbers down and get me a log of these in a few weeks

## 2020-07-15 NOTE — Progress Notes (Signed)
Subjective:  Christopher Gay is a 65 y.o. male who presents for Chief Complaint  Patient presents with  . Diabetes    Fasting med check. No concerns. Patient is due for diabetic eye exam-he is looking an eye doctor. Covid booster given today #2.     Here for med check on diabetes.  He is checking blood sugars at home.  After he had higher numbers last visit he has been working hard to get his blood sugars down.  He has been eating more carefully, getting some activity and exercise.  Fasting glucose overall has been pretty good although he gets some elevated numbers in the morning fasting.  No foot lesions other than the toenail fungus which he is using over-the-counter topical solution.  He does not want to use Lamisil oral due to the risks  Compliant with rest of medicines without complaint  He needs a referral to an eye doctor for diabetic screening  No other aggravating or relieving factors.    No other c/o.  Past Medical History:  Diagnosis Date  . Arthritis   . BPH without urinary obstruction   . Diabetes mellitus without complication (HCC) 2000   diagnosed in [place]  . Hyperlipidemia   . Hypertension     prior on medication year ago  . Kidney stone    Current Outpatient Medications on File Prior to Visit  Medication Sig Dispense Refill  . Insulin Pen Needle (BD PEN NEEDLE NANO U/F) 32G X 4 MM MISC 1 each by Does not apply route at bedtime. 100 each 11  . lisinopril (ZESTRIL) 20 MG tablet TAKE 1 TABLET BY MOUTH EVERY DAY 90 tablet 0  . metFORMIN (GLUCOPHAGE) 850 MG tablet TAKE 1 TABLET (850 MG TOTAL) BY MOUTH 2 (TWO) TIMES DAILY WITH A MEAL. 180 tablet 0  . rosuvastatin (CRESTOR) 20 MG tablet Take 1 tablet (20 mg total) by mouth daily. 90 tablet 1  . TRESIBA FLEXTOUCH 100 UNIT/ML FlexTouch Pen INJECT 0.25 MLS (25 UNITS TOTAL) INTO THE SKIN DAILY AT 10 PM. 3 mL 2   No current facility-administered medications on file prior to visit.    The following portions of the  patient's history were reviewed and updated as appropriate: allergies, current medications, past family history, past medical history, past social history, past surgical history and problem list.  ROS Otherwise as in subjective above    Objective: BP 110/70   Pulse 76   Resp 18   Ht 5\' 11"  (1.803 m)   Wt 185 lb 6.4 oz (84.1 kg)   BMI 25.86 kg/m   General appearance: alert, no distress, well developed, well nourished Neck: supple, no lymphadenopathy, no thyromegaly, no masses, no bruits Heart: RRR, normal S1, S2, no murmurs Lungs: CTA bilaterally, no wheezes, rhonchi, or rales Abdomen: +bs, soft, non tender, non distended, no masses, no hepatomegaly, no splenomegaly Pulses: 2+ radial pulses, 2+ pedal pulses, normal cap refill Ext: no edema Yellowish toenails throughout, thickened toenails throughout   Assessment: Encounter Diagnoses  Name Primary?  Uncontrolled type 2 diabetes mellitus with hyperglycemia (HCC) Yes  . Need for COVID-19 vaccine   . Hypertension associated with type 2 diabetes mellitus (HCC)   . Hyperlipidemia, unspecified hyperlipidemia type      Plan: Diabetes-labs today, continue current medications, continue daily foot checks, yearly eye doctor visits.  Hypertension associated with diabetes-continue same medicine, at goal  Hyperlipidemia- continue same medicine, reviewed labs from the fall 2021  Counseled on the Covid virus vaccine.  Vaccine information sheet given.  Covid vaccine given after consent obtained.  Referral to ophthalmology   Christopher Gay was seen today for diabetes.  Diagnoses and all orders for this visit:  Uncontrolled type 2 diabetes mellitus with hyperglycemia (HCC) -     Hemoglobin A1c -     Comprehensive metabolic panel -     Ambulatory referral to Ophthalmology  Need for COVID-19 vaccine -     PFIZER Comirnaty(GRAY TOP)COVID-19 Vaccine  Hypertension associated with type 2 diabetes mellitus (HCC) -     Comprehensive  metabolic panel  Hyperlipidemia, unspecified hyperlipidemia type    Follow up: pending labs

## 2020-07-16 LAB — COMPREHENSIVE METABOLIC PANEL
ALT: 27 IU/L (ref 0–44)
AST: 23 IU/L (ref 0–40)
Albumin/Globulin Ratio: 1.8 (ref 1.2–2.2)
Albumin: 4.8 g/dL (ref 3.8–4.8)
Alkaline Phosphatase: 52 IU/L (ref 44–121)
BUN/Creatinine Ratio: 17 (ref 10–24)
BUN: 13 mg/dL (ref 8–27)
Bilirubin Total: 0.7 mg/dL (ref 0.0–1.2)
CO2: 21 mmol/L (ref 20–29)
Calcium: 9.9 mg/dL (ref 8.6–10.2)
Chloride: 99 mmol/L (ref 96–106)
Creatinine, Ser: 0.78 mg/dL (ref 0.76–1.27)
Globulin, Total: 2.6 g/dL (ref 1.5–4.5)
Glucose: 145 mg/dL — ABNORMAL HIGH (ref 65–99)
Potassium: 4.8 mmol/L (ref 3.5–5.2)
Sodium: 136 mmol/L (ref 134–144)
Total Protein: 7.4 g/dL (ref 6.0–8.5)
eGFR: 100 mL/min/{1.73_m2} (ref >59)

## 2020-07-16 LAB — HEMOGLOBIN A1C
Est. average glucose Bld gHb Est-mCnc: 189 mg/dL
Hgb A1c MFr Bld: 8.2 % — ABNORMAL HIGH (ref 4.8–5.6)

## 2020-09-01 ENCOUNTER — Other Ambulatory Visit: Payer: Self-pay | Admitting: Medical

## 2020-09-21 ENCOUNTER — Encounter (HOSPITAL_COMMUNITY): Payer: Self-pay

## 2020-09-21 ENCOUNTER — Other Ambulatory Visit: Payer: Self-pay

## 2020-09-21 ENCOUNTER — Emergency Department (HOSPITAL_COMMUNITY): Payer: Federal, State, Local not specified - PPO

## 2020-09-21 ENCOUNTER — Emergency Department (HOSPITAL_COMMUNITY)
Admission: EM | Admit: 2020-09-21 | Discharge: 2020-09-21 | Disposition: A | Discharge status: Home / Self Care | Payer: Federal, State, Local not specified - PPO | Attending: Emergency Medicine | Admitting: Emergency Medicine

## 2020-09-21 ENCOUNTER — Ambulatory Visit (HOSPITAL_COMMUNITY)
Admission: EM | Admit: 2020-09-21 | Discharge: 2020-09-21 | Disposition: A | Discharge status: Home / Self Care | Payer: Federal, State, Local not specified - PPO

## 2020-09-21 DIAGNOSIS — E119 Type 2 diabetes mellitus without complications: Secondary | ICD-10-CM | POA: Diagnosis not present

## 2020-09-21 DIAGNOSIS — Z87891 Personal history of nicotine dependence: Secondary | ICD-10-CM | POA: Diagnosis not present

## 2020-09-21 DIAGNOSIS — R1013 Epigastric pain: Secondary | ICD-10-CM | POA: Diagnosis not present

## 2020-09-21 DIAGNOSIS — Z79899 Other long term (current) drug therapy: Secondary | ICD-10-CM | POA: Diagnosis not present

## 2020-09-21 DIAGNOSIS — R1084 Generalized abdominal pain: Secondary | ICD-10-CM | POA: Insufficient documentation

## 2020-09-21 DIAGNOSIS — I10 Essential (primary) hypertension: Secondary | ICD-10-CM | POA: Diagnosis not present

## 2020-09-21 DIAGNOSIS — Z7984 Long term (current) use of oral hypoglycemic drugs: Secondary | ICD-10-CM | POA: Insufficient documentation

## 2020-09-21 DIAGNOSIS — R109 Unspecified abdominal pain: Secondary | ICD-10-CM | POA: Diagnosis present

## 2020-09-21 DIAGNOSIS — Z794 Long term (current) use of insulin: Secondary | ICD-10-CM | POA: Diagnosis not present

## 2020-09-21 LAB — URINALYSIS, ROUTINE W REFLEX MICROSCOPIC
Bilirubin Urine: NEGATIVE
Glucose, UA: NEGATIVE mg/dL
Hgb urine dipstick: NEGATIVE
Ketones, ur: NEGATIVE mg/dL
Leukocytes,Ua: NEGATIVE
Nitrite: NEGATIVE
Protein, ur: NEGATIVE mg/dL
Specific Gravity, Urine: 1.01 (ref 1.005–1.030)
pH: 7 (ref 5.0–8.0)

## 2020-09-21 LAB — CBC WITH DIFFERENTIAL/PLATELET
Abs Immature Granulocytes: 0.01 10*3/uL (ref 0.00–0.07)
Basophils Absolute: 0 10*3/uL (ref 0.0–0.1)
Basophils Relative: 0 %
Eosinophils Absolute: 0.2 10*3/uL (ref 0.0–0.5)
Eosinophils Relative: 3 %
HCT: 36.5 % — ABNORMAL LOW (ref 39.0–52.0)
Hemoglobin: 13.1 g/dL (ref 13.0–17.0)
Immature Granulocytes: 0 %
Lymphocytes Relative: 24 %
Lymphs Abs: 1.8 10*3/uL (ref 0.7–4.0)
MCH: 31.7 pg (ref 26.0–34.0)
MCHC: 35.9 g/dL (ref 30.0–36.0)
MCV: 88.4 fL (ref 80.0–100.0)
Monocytes Absolute: 0.6 10*3/uL (ref 0.1–1.0)
Monocytes Relative: 8 %
Neutro Abs: 4.8 10*3/uL (ref 1.7–7.7)
Neutrophils Relative %: 65 %
Platelets: 211 10*3/uL (ref 150–400)
RBC: 4.13 MIL/uL — ABNORMAL LOW (ref 4.22–5.81)
RDW: 12.5 % (ref 11.5–15.5)
WBC: 7.5 10*3/uL (ref 4.0–10.5)
nRBC: 0 % (ref 0.0–0.2)

## 2020-09-21 LAB — COMPREHENSIVE METABOLIC PANEL
ALT: 32 U/L (ref 0–44)
AST: 23 U/L (ref 15–41)
Albumin: 4.4 g/dL (ref 3.5–5.0)
Alkaline Phosphatase: 46 U/L (ref 38–126)
Anion gap: 7 (ref 5–15)
BUN: 11 mg/dL (ref 8–23)
CO2: 26 mmol/L (ref 22–32)
Calcium: 9.6 mg/dL (ref 8.9–10.3)
Chloride: 104 mmol/L (ref 98–111)
Creatinine, Ser: 0.69 mg/dL (ref 0.61–1.24)
GFR, Estimated: >60 mL/min (ref >60)
Glucose, Bld: 176 mg/dL — ABNORMAL HIGH (ref 70–99)
Potassium: 4.1 mmol/L (ref 3.5–5.1)
Sodium: 137 mmol/L (ref 135–145)
Total Bilirubin: 0.8 mg/dL (ref 0.3–1.2)
Total Protein: 7.4 g/dL (ref 6.5–8.1)

## 2020-09-21 LAB — CBG MONITORING, ED: Glucose-Capillary: 140 mg/dL — ABNORMAL HIGH (ref 70–99)

## 2020-09-21 LAB — LIPASE, BLOOD: Lipase: 33 U/L (ref 11–51)

## 2020-09-21 MED ORDER — IOHEXOL 300 MG/ML  SOLN
100.0000 mL | Freq: Once | INTRAMUSCULAR | Status: AC | PRN
  Administered 2020-09-21: 100 mL via INTRAVENOUS

## 2020-09-21 NOTE — ED Triage Notes (Signed)
Pt reports abdominal pain, radiates to groin and legs x 1 week, worse in the past 2 days. Pt states he had a hernia repair surgery 20 years ago, and think he may injured the area.

## 2020-09-21 NOTE — ED Provider Notes (Signed)
[facility]    CSN: [id number] Arrival date & time: 09/21/20  1040      History   Chief Complaint Chief Complaint  Patient presents with   Abdominal Pain   Groin Pain   Leg Pain    HPI Christopher Gay is a 65 y.o. male presenting with abdominal pain radiating to groin and legs for 2 weeks, getting worse in the last 2 days.  Distant history of umbilical hernia and subsequent repair 20 years ago, he is concerned that he reinjured the area with heavy lifting about 1 week ago.  Denies change in bowel or bladder function, denies urinary symptoms.  He is constipated, last bowel movement was 4 days ago but he is still passing gas.  Denies nausea and vomiting.  Feeling well otherwise.  Denies chest pain, shortness of breath, dizziness, weakness, headaches.  HPI  Past Medical History:  Diagnosis Date   Arthritis    BPH without urinary obstruction    Diabetes mellitus without complication (HCC) 2000   diagnosed in [place]   Hyperlipidemia    Hypertension     prior on medication year ago   Kidney stone     Patient Active Problem List   Diagnosis Date Noted   Need for COVID-19 vaccine 07/15/2020   Encounter for health maintenance examination in adult 12/23/2019   Encounter for hepatitis C screening test for low risk patient 12/23/2019   Need for vaccination 12/23/2019   Hypertrophic toenail 12/23/2019   BPH without obstruction/lower urinary tract symptoms 04/15/2019   Screening for prostate cancer 04/15/2019   Uncontrolled type 2 diabetes mellitus with hyperglycemia (HCC) 11/20/2018   Need for influenza vaccination 11/20/2018   Onychomycosis 11/20/2018   Prostatitis 10/10/2018   Mixed dyslipidemia 09/13/2017   Elevated LFTs 09/13/2017   Microalbuminuria 09/13/2017   Polyp of colon 09/13/2017   Hypertension associated with type 2 diabetes mellitus (HCC) 06/21/2017   Hyperlipidemia 06/21/2017   Vaccine counseling 06/21/2017   Need for pneumococcal vaccination  06/21/2017    Past Surgical History:  Procedure Laterality Date   HERNIA REPAIR     KIDNEY STONE SURGERY         Home Medications    Prior to Admission medications   Medication Sig Start Date End Date Taking? Authorizing Provider  Insulin Pen Needle (BD PEN NEEDLE NANO U/F) 32G X 4 MM MISC 1 each by Does not apply route at bedtime. 12/24/19   Tysinger, Kermit Balo, PA-C  lisinopril (ZESTRIL) 20 MG tablet TAKE 1 TABLET BY MOUTH EVERY DAY 07/13/20   Tysinger, Kermit Balo, PA-C  metFORMIN (GLUCOPHAGE) 850 MG tablet TAKE 1 TABLET (850 MG TOTAL) BY MOUTH 2 (TWO) TIMES DAILY WITH A MEAL. 09/01/20   Henson, Vickie L, NP-C  rosuvastatin (CRESTOR) 20 MG tablet Take 1 tablet (20 mg total) by mouth daily. 04/15/20   Tysinger, Kermit Balo, PA-C  TRESIBA FLEXTOUCH 100 UNIT/ML FlexTouch Pen INJECT 0.25 MLS (25 UNITS TOTAL) INTO THE SKIN DAILY AT 10 PM. 12/23/19   Tysinger, Kermit Balo, PA-C    Family History Family History  Problem Relation Age of Onset   Cancer Mother        breast   Cancer Paternal Grandmother        colon    Social History Social History   Tobacco Use   Smoking status: Former    Pack years: 0.00    Types: Cigars   Smokeless tobacco: Never  Substance Use Topics   Alcohol use: Yes  Comment: once a month   Drug use: Never     Allergies   Doxycycline   Review of Systems Review of Systems  Constitutional:  Negative for appetite change, chills, diaphoresis, fever and unexpected weight change.  HENT:  Negative for congestion, ear pain, sinus pressure, sinus pain, sneezing, sore throat and trouble swallowing.   Respiratory:  Negative for cough, chest tightness and shortness of breath.   Cardiovascular:  Negative for chest pain.  Gastrointestinal:  Positive for abdominal pain and constipation. Negative for abdominal distention, anal bleeding, blood in stool, diarrhea, nausea, rectal pain and vomiting.  Genitourinary:  Negative for dysuria, flank pain, frequency and urgency.   Musculoskeletal:  Negative for back pain and myalgias.  Neurological:  Negative for dizziness, light-headedness and headaches.  All other systems reviewed and are negative.   Physical Exam Triage Vital Signs ED Triage Vitals  Enc Vitals Group     BP --      Pulse --      Resp 09/21/20 1231 18     Temp --      Temp src --      SpO2 --      Weight --      Height --      Head Circumference --      Peak Flow --      Pain Score 09/21/20 1235 0     Pain Loc --      Pain Edu? --      Excl. in GC? --    No data found.  Updated Vital Signs Resp 18   Visual Acuity Right Eye Distance:   Left Eye Distance:   Bilateral Distance:    Right Eye Near:   Left Eye Near:    Bilateral Near:     Physical Exam Vitals reviewed.  Constitutional:      General: He is not in acute distress.    Appearance: Normal appearance. He is not ill-appearing.  HENT:     Head: Normocephalic and atraumatic.     Mouth/Throat:     Mouth: Mucous membranes are moist.     Comments: Moist mucous membranes Eyes:     Extraocular Movements: Extraocular movements intact.     Pupils: Pupils are equal, round, and reactive to light.  Cardiovascular:     Rate and Rhythm: Normal rate and regular rhythm.     Heart sounds: Normal heart sounds.  Pulmonary:     Effort: Pulmonary effort is normal.     Breath sounds: Normal breath sounds. No wheezing, rhonchi or rales.  Abdominal:     General: Bowel sounds are normal. There is distension.     Palpations: Abdomen is soft. There is no mass.     Tenderness: There is abdominal tenderness in the epigastric area. There is no right CVA tenderness, left CVA tenderness, guarding or rebound.     Comments: TTP above umbilicus. Abd is distended and hard, but no obvious mass palpated. BS full throughout.   Skin:    General: Skin is warm.     Capillary Refill: Capillary refill takes less than 2 seconds.     Comments: Good skin turgor  Neurological:     General: No focal  deficit present.     Mental Status: He is alert and oriented to person, place, and time.  Psychiatric:        Mood and Affect: Mood normal.        Behavior: Behavior normal.     UC  Treatments / Results  Labs (all labs ordered are listed, but only abnormal results are displayed) Labs Reviewed - No data to display  EKG   Radiology No results found.  Procedures Procedures (including critical care time)  Medications Ordered in UC Medications - No data to display  Initial Impression / Assessment and Plan / UC Course  I have reviewed the triage vital signs and the nursing notes.  Pertinent labs & imaging results that were available during my care of the patient were reviewed by me and considered in my medical decision making (see chart for details).     This patient is a very pleasant 65 y.o. year old male presenting with suspected hernia. I do not currently think hernia is incarcerated, though cannot completely rule this out in urgent care setting. Patient states he wishes to proceed to ED for this evaluation. He is hemodynamically stable for transport in personal vehicle at this time.  Final Clinical Impressions(s) / UC Diagnoses   Final diagnoses:  Epigastric pain   Discharge Instructions   None    ED Prescriptions   None    PDMP not reviewed this encounter.   Rhys Martini, PA-C 09/21/20 1252

## 2020-09-21 NOTE — Discharge Instructions (Addendum)
Follow-up with your primary care doctor.  Come back to ER for worsening abdominal pain, vomiting or other new concerning symptoms.

## 2020-09-21 NOTE — ED Triage Notes (Signed)
Pt here d/t abdominal pain, groin pain and leg pain. Pt states he had a hernia mesh placed and he thinks it may have come loose or a new hernia.

## 2020-09-21 NOTE — ED Notes (Signed)
Pt refused to check hsi vital sign as he will go to the ED after talking to the PA.

## 2020-09-21 NOTE — ED Provider Notes (Signed)
[facility] EMERGENCY DEPARTMENT Provider Note   CSN: [id number] Arrival date & time: 09/21/20  1240     History No chief complaint on file.   Corliss Lamartina is a 65 y.o. male.  Presents to ER with concern for abdominal pain.  Patient reports that couple weeks ago after lifting some heavy objects he started having some abdominal discomfort, aching, pain and across his abdomen and in his groin region.  No pain in testicles or scrotum.  Also has had some leg cramping.  At present he actually has no ongoing abdominal complaint or leg complaint.  Walking without difficulty.  Some constipation, passing gas regularly.  No nausea or vomiting.  Endorses history of hernias.  Had surgery many years ago in [place].  HPI     Past Medical History:  Diagnosis Date   Arthritis    BPH without urinary obstruction    Diabetes mellitus without complication (HCC) 2000   diagnosed in [place]   Hyperlipidemia    Hypertension     prior on medication year ago   Kidney stone     Patient Active Problem List   Diagnosis Date Noted   Need for COVID-19 vaccine 07/15/2020   Encounter for health maintenance examination in adult 12/23/2019   Encounter for hepatitis C screening test for low risk patient 12/23/2019   Need for vaccination 12/23/2019   Hypertrophic toenail 12/23/2019   BPH without obstruction/lower urinary tract symptoms 04/15/2019   Screening for prostate cancer 04/15/2019   Uncontrolled type 2 diabetes mellitus with hyperglycemia (HCC) 11/20/2018   Need for influenza vaccination 11/20/2018   Onychomycosis 11/20/2018   Prostatitis 10/10/2018   Mixed dyslipidemia 09/13/2017   Elevated LFTs 09/13/2017   Microalbuminuria 09/13/2017   Polyp of colon 09/13/2017   Hypertension associated with type 2 diabetes mellitus (HCC) 06/21/2017   Hyperlipidemia 06/21/2017   Vaccine counseling 06/21/2017   Need for pneumococcal vaccination 06/21/2017    Past Surgical History:   Procedure Laterality Date   HERNIA REPAIR     KIDNEY STONE SURGERY         Family History  Problem Relation Age of Onset   Cancer Mother        breast   Cancer Paternal Grandmother        colon    Social History   Tobacco Use   Smoking status: Former    Pack years: 0.00    Types: Cigars   Smokeless tobacco: Never  Substance Use Topics   Alcohol use: Yes    Comment: once a month   Drug use: Never    Home Medications Prior to Admission medications   Medication Sig Start Date End Date Taking? Authorizing Provider  Insulin Pen Needle (BD PEN NEEDLE NANO U/F) 32G X 4 MM MISC 1 each by Does not apply route at bedtime. 12/24/19   Tysinger, Kermit Balo, PA-C  lisinopril (ZESTRIL) 20 MG tablet TAKE 1 TABLET BY MOUTH EVERY DAY 07/13/20   Tysinger, Kermit Balo, PA-C  metFORMIN (GLUCOPHAGE) 850 MG tablet TAKE 1 TABLET (850 MG TOTAL) BY MOUTH 2 (TWO) TIMES DAILY WITH A MEAL. 09/01/20   Henson, Vickie L, NP-C  rosuvastatin (CRESTOR) 20 MG tablet Take 1 tablet (20 mg total) by mouth daily. 04/15/20   Tysinger, Kermit Balo, PA-C  TRESIBA FLEXTOUCH 100 UNIT/ML FlexTouch Pen INJECT 0.25 MLS (25 UNITS TOTAL) INTO THE SKIN DAILY AT 10 PM. 12/23/19   Tysinger, Kermit Balo, PA-C    Allergies    Doxycycline  Review of Systems   Review of Systems  Constitutional:  Negative for chills and fever.  HENT:  Negative for ear pain and sore throat.   Eyes:  Negative for pain and visual disturbance.  Respiratory:  Negative for cough and shortness of breath.   Cardiovascular:  Negative for chest pain and palpitations.  Gastrointestinal:  Positive for abdominal pain and constipation. Negative for vomiting.  Genitourinary:  Negative for dysuria and hematuria.  Musculoskeletal:  Positive for arthralgias. Negative for back pain.  Skin:  Negative for color change and rash.  Neurological:  Negative for seizures and syncope.  All other systems reviewed and are negative.  Physical Exam Updated Vital Signs BP (!) 143/79  (BP Location: Right Arm)   Pulse 68   Temp 97.6 F (36.4 C) (Oral)   Resp 18   Ht 5\' 11"  (1.803 m)   Wt 86.2 kg   SpO2 99%   BMI 26.50 kg/m   Physical Exam Vitals and nursing note reviewed.  Constitutional:      Appearance: He is well-developed.  HENT:     Head: Normocephalic and atraumatic.  Eyes:     Conjunctiva/sclera: Conjunctivae normal.  Cardiovascular:     Rate and Rhythm: Normal rate and regular rhythm.     Heart sounds: No murmur heard. Pulmonary:     Effort: Pulmonary effort is normal. No respiratory distress.     Breath sounds: Normal breath sounds.  Abdominal:     Palpations: Abdomen is soft.     Tenderness: There is no abdominal tenderness.  Genitourinary:    Comments: Penis and testicles appear normal, there is no appreciable hernia and bilateral inguinal regions, RN chaperone Musculoskeletal:     Cervical back: Neck supple.     Comments: Bilateral legs appear normal, no tenderness to palpation throughout, compartments soft, sensation normal, normal distal pulses bilaterally  Skin:    General: Skin is warm and dry.  Neurological:     General: No focal deficit present.     Mental Status: He is alert and oriented to person, place, and time.  Psychiatric:        Mood and Affect: Mood normal.    ED Results / Procedures / Treatments   Labs (all labs ordered are listed, but only abnormal results are displayed) Labs Reviewed  COMPREHENSIVE METABOLIC PANEL - Abnormal; Notable for the following components:      Result Value   Glucose, Bld 176 (*)    All other components within normal limits  CBC WITH DIFFERENTIAL/PLATELET - Abnormal; Notable for the following components:   RBC 4.13 (*)    HCT 36.5 (*)    All other components within normal limits  CBG MONITORING, ED - Abnormal; Notable for the following components:   Glucose-Capillary 140 (*)    All other components within normal limits  LIPASE, BLOOD  URINALYSIS, ROUTINE W REFLEX MICROSCOPIC     EKG None  Radiology CT ABDOMEN PELVIS W CONTRAST  Result Date: 09/21/2020 CLINICAL DATA:  Nonlocalized abdominal pain concern for umbilical or ventral hernia. EXAM: CT ABDOMEN AND PELVIS WITH CONTRAST TECHNIQUE: Multidetector CT imaging of the abdomen and pelvis was performed using the standard protocol following bolus administration of intravenous contrast. CONTRAST:  11/22/2020 OMNIPAQUE IOHEXOL 300 MG/ML  SOLN COMPARISON:  None FINDINGS: Lower chest: Right lower lobe calcified granuloma. Normal size heart. No significant pericardial effusion/thickening. Hepatobiliary: Enhancing 6 mm focus along the falciform ligament on image 24/3, which equilibrates to background liver on delayed sequence. There  are few hyperdense small foci in the lumen of the gallbladder for instance on image 27/3 and 23/3 which may represent small stones, no evidence of acute cholecystitis. No biliary ductal dilation. Pancreas: Within normal limits. Spleen: Within normal limits. Adrenals/Urinary Tract: Bilateral adrenal glands are unremarkable. No hydronephrosis. Punctate 1-2 mm nonobstructive renal stones in the bilateral lower poles. Bilateral hypodense renal lesions, the larger of which demonstrate near fluid density consistent with renal cysts the largest of which is in the upper pole of the left kidney and measures 6.2 cm. There are numerous additional bilateral hypodense subcentimeter lesions which are technically too small to accurately characterize but also statistically likely represent cysts. No solid enhancing renal lesion. Urinary bladder is grossly unremarkable for degree of distension. Stomach/Bowel: Stomach is unremarkable. No pathologic dilation of small bowel. The appendix and terminal ileum appear normal. No suspicious colonic wall thickening, mass like lesions or diverticular disease identified. No evidence of acute bowel inflammation. Vascular/Lymphatic: Scattered aortic atherosclerosis without aneurysmal dilation.  No pathologically enlarged abdominal or pelvic lymph nodes identified. Reproductive: Prostate is unremarkable. Other: No abdominal wall hernia or abnormality. No abdominopelvic ascites. Musculoskeletal: Multilevel degenerative changes spine. Bony ankylosis of the thoracic spine. Degenerative changes bilateral hips and SI joints with bony ankylosis of the left SI joint. No acute osseous abnormality. IMPRESSION: 1. No acute findings in the abdomen or pelvis. No evidence of acute bowel inflammation. 2. No abdominal wall hernia. 3. Bilateral nonobstructing nephrolithiasis. 4. Bilateral renal cysts. 5. Cholelithiasis without evidence of acute cholecystitis. 6. Enhancing 6 mm focus along the falciform ligament likely represents a benign flash filling hemangioma or intrahepatic shunt. 7.  Aortic Atherosclerosis (ICD10-I70.0). Electronically Signed   By: Maudry Mayhew MD   On: 09/21/2020 23:10    Procedures Procedures   Medications Ordered in ED Medications  iohexol (OMNIPAQUE) 300 MG/ML solution 100 mL (100 mLs Intravenous Contrast Given 09/21/20 2231)    ED Course  I have reviewed the triage vital signs and the nursing notes.  Pertinent labs & imaging results that were available during my care of the patient were reviewed by me and considered in my medical decision making (see chart for details).    MDM Rules/Calculators/A&P                          65 year old male presents to ER with concern for abdominal discomfort after reported heavy lifting and reported prior history of hernia.  Sent by urgent care to rule out incarcerated hernia.  On physical exam patient is remarkably well-appearing in no acute distress.  His abdomen is actually quite soft and denies appreciable tenderness.  Given his history, check CT scan.  Negative for any acute pathology.  No hernia.  On reassessment he remains well-appearing with no ongoing symptoms.  Given negative work-up, discharged home with plan to follow-up with his  primary doctor.    After the discussed management above, the patient was determined to be safe for discharge.  The patient was in agreement with this plan and all questions regarding their care were answered.  ED return precautions were discussed and the patient will return to the ED with any significant worsening of condition.  Final Clinical Impression(s) / ED Diagnoses Final diagnoses:  Generalized abdominal pain    Rx / DC Orders ED Discharge Orders     None        Milagros Loll, MD 09/22/20 1517

## 2020-09-21 NOTE — ED Provider Notes (Signed)
Emergency Medicine Provider Triage Evaluation Note  Christopher Gay , a 65 y.o. male  was evaluated in triage.  Pt complains of abdominal pain. Started last week after lifting a heavy tractor part. Been intermittent and worsening the past two days. Radiates down into his groin and legs. States his legs feel "like they're cramping". Denies any urinary complaints. No n/v. Last BM 4 days ago but still passing gas.  Physical Exam  BP 129/73   Pulse 79   Temp 98.3 F (36.8 C)   Resp 14   Ht 5\' 11"  (1.803 m)   Wt 86.2 kg   SpO2 100%   BMI 26.50 kg/m  Gen:   Awake, no distress   Resp:  Normal effort  MSK:   Moves extremities without difficulty  Other:    Medical Decision Making  Medically screening exam initiated at 2:29 PM.  Appropriate orders placed.  Jakobie Henslee was informed that the remainder of the evaluation will be completed by another provider, this initial triage assessment does not replace that evaluation, and the importance of remaining in the ED until their evaluation is complete.     Elliot Cousin, PA-C 09/21/20 1430    11/22/20, MD 09/22/20 1645

## 2020-09-21 NOTE — ED Notes (Signed)
Patient transported to CT 

## 2020-10-10 ENCOUNTER — Other Ambulatory Visit: Payer: Self-pay | Admitting: Medical

## 2020-10-16 ENCOUNTER — Ambulatory Visit: Payer: Federal, State, Local not specified - PPO | Admitting: Medical

## 2020-11-17 ENCOUNTER — Other Ambulatory Visit: Payer: Self-pay | Admitting: Medical

## 2020-11-17 ENCOUNTER — Other Ambulatory Visit: Payer: Self-pay

## 2020-11-17 ENCOUNTER — Ambulatory Visit: Payer: Medicare Other | Admitting: Medical

## 2020-11-17 ENCOUNTER — Telehealth: Payer: Self-pay | Admitting: Medical

## 2020-11-17 VITALS — BP 130/78 | HR 72 | Wt 191.8 lb

## 2020-11-17 DIAGNOSIS — Z23 Encounter for immunization: Secondary | ICD-10-CM | POA: Diagnosis not present

## 2020-11-17 NOTE — Telephone Encounter (Signed)
Pt states he needs a refill for his metformin  Please send to the [id number] - [place], [place] - [address] OF [address]

## 2020-11-18 MED ORDER — METFORMIN HCL 850 MG PO TABS: 850.0000 mg | ORAL_TABLET | Freq: Two times a day (BID) | ORAL | 0 refills | Status: DC

## 2020-11-18 NOTE — Telephone Encounter (Signed)
Forwarding this to yall

## 2020-11-18 NOTE — Telephone Encounter (Signed)
Patient states he just had to leave, he was hearing too much from the patient room beside of him and felt uncomfortable.  He said he just told people something came up.  He said he has been homeless for a while and is now back home.  He hasn't been able to exercise and new his blood sugar was out of control..  He apologized for leaving.  I thanked him for coming in this morning to talk with me and giving me details that we can work on.

## 2020-11-18 NOTE — Progress Notes (Signed)
error 

## 2020-11-19 ENCOUNTER — Other Ambulatory Visit: Payer: Self-pay | Admitting: Medical

## 2020-12-28 ENCOUNTER — Other Ambulatory Visit: Payer: Self-pay | Admitting: Medical

## 2021-01-10 ENCOUNTER — Other Ambulatory Visit: Payer: Self-pay | Admitting: Medical

## 2021-01-11 NOTE — Telephone Encounter (Signed)
Has an appt in December 

## 2021-02-03 ENCOUNTER — Other Ambulatory Visit: Payer: Self-pay | Admitting: Medical

## 2021-02-03 NOTE — Telephone Encounter (Signed)
Pt has an appt in december 

## 2021-02-08 ENCOUNTER — Other Ambulatory Visit: Payer: Self-pay | Admitting: Medical

## 2021-02-15 ENCOUNTER — Other Ambulatory Visit: Payer: Self-pay | Admitting: Medical

## 2021-02-23 ENCOUNTER — Encounter: Payer: Self-pay | Admitting: Medical

## 2021-02-23 ENCOUNTER — Other Ambulatory Visit: Payer: Self-pay

## 2021-02-23 ENCOUNTER — Ambulatory Visit (INDEPENDENT_AMBULATORY_CARE_PROVIDER_SITE_OTHER): Payer: Medicare Other | Admitting: Medical

## 2021-02-23 VITALS — BP 130/82 | HR 70 | Ht 70.0 in | Wt 194.8 lb

## 2021-02-23 DIAGNOSIS — E1165 Type 2 diabetes mellitus with hyperglycemia: Secondary | ICD-10-CM

## 2021-02-23 DIAGNOSIS — Z7185 Encounter for immunization safety counseling: Secondary | ICD-10-CM | POA: Diagnosis not present

## 2021-02-23 DIAGNOSIS — E785 Hyperlipidemia, unspecified: Secondary | ICD-10-CM

## 2021-02-23 DIAGNOSIS — R809 Proteinuria, unspecified: Secondary | ICD-10-CM

## 2021-02-23 DIAGNOSIS — Z23 Encounter for immunization: Secondary | ICD-10-CM | POA: Diagnosis not present

## 2021-02-23 DIAGNOSIS — I152 Hypertension secondary to endocrine disorders: Secondary | ICD-10-CM

## 2021-02-23 DIAGNOSIS — R350 Frequency of micturition: Secondary | ICD-10-CM

## 2021-02-23 DIAGNOSIS — E118 Type 2 diabetes mellitus with unspecified complications: Secondary | ICD-10-CM | POA: Insufficient documentation

## 2021-02-23 DIAGNOSIS — E1159 Type 2 diabetes mellitus with other circulatory complications: Secondary | ICD-10-CM

## 2021-02-23 DIAGNOSIS — Z125 Encounter for screening for malignant neoplasm of prostate: Secondary | ICD-10-CM

## 2021-02-23 DIAGNOSIS — Q845 Enlarged and hypertrophic nails: Secondary | ICD-10-CM

## 2021-02-23 LAB — POCT GLYCOSYLATED HEMOGLOBIN (HGB A1C): Hemoglobin A1C: 7.9 % — AB (ref 4.0–5.6)

## 2021-02-23 NOTE — Progress Notes (Signed)
Subjective:  Christopher Gay is a 65 y.o. male who presents for Chief Complaint  Patient presents with   Diabetes    States he does not want to schedule an eye exam yet and does not know when he will. Does wish to get Pfizer vaccine today   Here for med check.  Diabetes - compliant with metformin 850mg  BID.   Doing Tresiba 25 units daily.  Sugars been looking good.  Eats oatmeal most mornings.  Consistent with diet.  No polydipsia, no blurred vision.   Active.     Compliant with lisinopril.  Compliant with Crestor without c/o.    Been on the road a lot lately.   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  Hard to find healthy things to eat while on the road  No other aggravating or relieving factors.    No other c/o.  Past Medical History:  Diagnosis Date   Arthritis    BPH without urinary obstruction    Diabetes mellitus without complication (HCC) 2000   diagnosed in [place]   Hyperlipidemia    Hypertension     prior on medication year ago   Kidney stone    Current Outpatient Medications on File Prior to Visit  Medication Sig Dispense Refill   metFORMIN (GLUCOPHAGE) 850 MG tablet TAKE 1 TABLET (850 MG TOTAL) BY MOUTH 2 (TWO) TIMES DAILY WITH A MEAL. 180 tablet 0   rosuvastatin (CRESTOR) 20 MG tablet TAKE 1 TABLET BY MOUTH EVERY DAY 90 tablet 0   TRESIBA FLEXTOUCH 100 UNIT/ML FlexTouch Pen INJECT 25UNITS INTO THE SKIN DAILY AT 10PM 3 mL 1   BD PEN NEEDLE NANO 2ND GEN 32G X 4 MM MISC 1 EACH BY DOES NOT APPLY ROUTE AT BEDTIME. 100 each 1   lisinopril (ZESTRIL) 20 MG tablet TAKE 1 TABLET BY MOUTH EVERY DAY (Patient not taking: Reported on 02/23/2021) 30 tablet 0   No current facility-administered medications on file prior to visit.     The following portions of the patient's history were reviewed and updated as appropriate: allergies, current medications, past family history, past medical history, past social history, past surgical history and  problem list.  ROS Otherwise as in subjective above    Objective: BP 130/82 (BP Location: Right Arm, Patient Position: Sitting)    Pulse 70    Ht 5\' 10"  (1.778 m)    Wt 194 lb 12.8 oz (88.4 kg)    SpO2 95%    BMI 27.95 kg/m   General appearance: alert, no distress, well developed, well nourished Neck: supple, no lymphadenopathy, no thyromegaly, no masses Heart: RRR, normal S1, S2, no murmurs Lungs: CTA bilaterally, no wheezes, rhonchi, or rales Pulses: 2+ radial pulses, 2+ pedal pulses, normal cap refill Ext: no edema  Diabetic Foot Exam - Simple   Simple Foot Form Diabetic Foot exam was performed with the following findings: Yes 02/23/2021  9:52 AM  Visual Inspection See comments: Yes Sensation Testing Intact to touch and monofilament testing bilaterally: Yes Pulse Check Posterior Tibialis and Dorsalis pulse intact bilaterally: Yes Comments Yellowish toenails throughout, some mild to moderate thickening of nails, particularly great toenails, otherwise no deformities      Assessment: Encounter Diagnoses  Name Primary?   Type II diabetes mellitus with complication (HCC) Yes   Uncontrolled type 2 diabetes mellitus with hyperglycemia (HCC)    Vaccine counseling    Screening for prostate cancer    Hypertension associated with type 2  diabetes mellitus (HCC)    Hyperlipidemia, unspecified hyperlipidemia type    Microalbuminuria    Need for COVID-19 vaccine    Enlarged and hypertrophic nails    Frequency of micturition      Plan: Diabetes -continue metformin 850 mg twice daily.  Increase Tresiba to 28 units daily.  Discussed increasing 2 units every week until morning sugars are less than 130 fasting.  Continue daily foot checks.  See eye doctor soon as planned and make sure they send [place] a copy of the eye doctor note  Microalbuminuria - continue ACEi  Hypertension associated with diabetes-continue current medication, blood pressure at goal  Hyperlipidemia-updated labs  today.  Continue statin  Hypertrophic nails, discolored nails likely onychomycosis-we discussed potentially using Lamisil oral versus topical.  Discussed pros and cons.  He wants to hold off on this at this time.  He declines referral to podiatry  Counseled on the Covid virus vaccine.  Vaccine information sheet given.  Covid vaccine given after consent obtained.  Routine prostate cancer screening   Christopher Gay was seen today for diabetes.  Diagnoses and all orders for this visit:  Type II diabetes mellitus with complication (HCC)  Uncontrolled type 2 diabetes mellitus with hyperglycemia (HCC) -     HgB A1c -     Microalbumin/Creatinine Ratio, Urine  Vaccine counseling  Screening for prostate cancer -     PSA  Hypertension associated with type 2 diabetes mellitus (HCC)  Hyperlipidemia, unspecified hyperlipidemia type -     Lipid panel  Microalbuminuria  Need for COVID-19 vaccine -     Pfizer Covid-19 Vaccine Bivalent Booster  Enlarged and hypertrophic nails  Frequency of micturition -     PSA  Follow up: pending labs

## 2021-02-23 NOTE — Patient Instructions (Addendum)
Recommendations: Continue Metformin 850mg  twice daily Increase Tresiba long acting insulin to 28 units for a week, then go up to 30 units per day. You can increase Tresiba 2 units every week until morning sugars are less than 130 Continue your blood pressure and cholesterol medication regularly Continue healthy low sugar diet

## 2021-02-24 LAB — LIPID PANEL
Chol/HDL Ratio: 3.1 ratio (ref 0.0–5.0)
Cholesterol, Total: 111 mg/dL (ref 100–199)
HDL: 36 mg/dL — ABNORMAL LOW (ref >39)
LDL Chol Calc (NIH): 56 mg/dL (ref 0–99)
Triglycerides: 97 mg/dL (ref 0–149)
VLDL Cholesterol Cal: 19 mg/dL (ref 5–40)

## 2021-02-24 LAB — PSA: Prostate Specific Ag, Serum: 1.2 ng/mL (ref 0.0–4.0)

## 2021-02-24 LAB — MICROALBUMIN / CREATININE URINE RATIO
Creatinine, Urine: 38.5 mg/dL
Microalb/Creat Ratio: 129 mg/g creat — ABNORMAL HIGH (ref 0–29)
Microalbumin, Urine: 49.5 ug/mL

## 2021-03-02 ENCOUNTER — Other Ambulatory Visit: Payer: Self-pay | Admitting: Medical

## 2021-03-17 ENCOUNTER — Ambulatory Visit (HOSPITAL_COMMUNITY)
Admission: EM | Admit: 2021-03-17 | Discharge: 2021-03-17 | Disposition: A | Discharge status: Home / Self Care | Payer: Medicare Other | Attending: Physician Assistant | Admitting: Physician Assistant

## 2021-03-17 ENCOUNTER — Encounter (HOSPITAL_COMMUNITY): Payer: Self-pay

## 2021-03-17 ENCOUNTER — Other Ambulatory Visit: Payer: Self-pay

## 2021-03-17 DIAGNOSIS — R1032 Left lower quadrant pain: Secondary | ICD-10-CM | POA: Insufficient documentation

## 2021-03-17 DIAGNOSIS — R195 Other fecal abnormalities: Secondary | ICD-10-CM | POA: Diagnosis present

## 2021-03-17 DIAGNOSIS — R194 Change in bowel habit: Secondary | ICD-10-CM | POA: Insufficient documentation

## 2021-03-17 LAB — COMPREHENSIVE METABOLIC PANEL
ALT: 46 U/L — ABNORMAL HIGH (ref 0–44)
AST: 33 U/L (ref 15–41)
Albumin: 4.1 g/dL (ref 3.5–5.0)
Alkaline Phosphatase: 58 U/L (ref 38–126)
Anion gap: 7 (ref 5–15)
BUN: 21 mg/dL (ref 8–23)
CO2: 23 mmol/L (ref 22–32)
Calcium: 9.1 mg/dL (ref 8.9–10.3)
Chloride: 105 mmol/L (ref 98–111)
Creatinine, Ser: 0.79 mg/dL (ref 0.61–1.24)
GFR, Estimated: >60 mL/min (ref >60)
Glucose, Bld: 224 mg/dL — ABNORMAL HIGH (ref 70–99)
Potassium: 4.3 mmol/L (ref 3.5–5.1)
Sodium: 135 mmol/L (ref 135–145)
Total Bilirubin: 0.9 mg/dL (ref 0.3–1.2)
Total Protein: 6.7 g/dL (ref 6.5–8.1)

## 2021-03-17 LAB — CBC WITH DIFFERENTIAL/PLATELET
Abs Immature Granulocytes: 0.01 10*3/uL (ref 0.00–0.07)
Basophils Absolute: 0 10*3/uL (ref 0.0–0.1)
Basophils Relative: 0 %
Eosinophils Absolute: 0.1 10*3/uL (ref 0.0–0.5)
Eosinophils Relative: 2 %
HCT: 33.5 % — ABNORMAL LOW (ref 39.0–52.0)
Hemoglobin: 12.4 g/dL — ABNORMAL LOW (ref 13.0–17.0)
Immature Granulocytes: 0 %
Lymphocytes Relative: 36 %
Lymphs Abs: 1.7 10*3/uL (ref 0.7–4.0)
MCH: 31.4 pg (ref 26.0–34.0)
MCHC: 37 g/dL — ABNORMAL HIGH (ref 30.0–36.0)
MCV: 84.8 fL (ref 80.0–100.0)
Monocytes Absolute: 0.5 10*3/uL (ref 0.1–1.0)
Monocytes Relative: 11 %
Neutro Abs: 2.4 10*3/uL (ref 1.7–7.7)
Neutrophils Relative %: 51 %
Platelets: 206 10*3/uL (ref 150–400)
RBC: 3.95 MIL/uL — ABNORMAL LOW (ref 4.22–5.81)
RDW: 12.4 % (ref 11.5–15.5)
WBC: 4.7 10*3/uL (ref 4.0–10.5)
nRBC: 0 % (ref 0.0–0.2)

## 2021-03-17 MED ORDER — AMOXICILLIN-POT CLAVULANATE 875-125 MG PO TABS: 1.0000 | ORAL_TABLET | Freq: Two times a day (BID) | ORAL | 0 refills | Status: AC

## 2021-03-17 NOTE — ED Provider Notes (Signed)
[place]    CSN: [id number] Arrival date & time: 03/17/21  1433      History   Chief Complaint Chief Complaint  Patient presents with   Abdominal Pain    HPI Christopher Gay is a 66 y.o. male.   Patient presents today with a 6-day history of intermittent abdominal pain.  Reports symptoms began suddenly after eating at one of his favorite restaurants and he developed profuse diarrhea.  This began to improve but has been intermittent since that time.  Currently pain is rated 2/3 on a 0-10 pain scale, localized to left lower quadrant, described as aching, no aggravating relieving factors identified.  He reports pain has been as severe as a 7 following meals.  He denies any nausea or vomiting but does believe he had a fever following symptom onset.  He reports that his bowels have been more solid over the past few days but continued to be loose; denies any blood or mucus.  He denies history of diverticulitis or diverticulosis.  He has had hernia mesh placed but denies additional abdominal surgery.  He has not tried any over-the-counter medication for symptom management.  He does take metformin but has been stable on this medication for several years.  Denies any recent antibiotic use, medication changes other than stopping [place] on his own, recent travel, known sick contacts.     Past Medical History:  Diagnosis Date   Arthritis    BPH without urinary obstruction    Diabetes mellitus without complication ([place]) [id number]   diagnosed in [place]   Hyperlipidemia    Hypertension     prior on medication year ago   Kidney stone     Patient Active Problem List   Diagnosis Date Noted   Enlarged and hypertrophic nails 02/23/2021   Type II diabetes mellitus with complication ([place]) 0000000   Need for COVID-19 vaccine 07/15/2020   Encounter for health maintenance examination in adult 12/23/2019   Encounter for hepatitis C screening test for low risk patient 12/23/2019   Need for  vaccination 12/23/2019   Hypertrophic toenail 12/23/2019   BPH without obstruction/lower urinary tract symptoms 04/15/2019   Screening for prostate cancer 04/15/2019   Uncontrolled type 2 diabetes mellitus with hyperglycemia ([place]) 11/20/2018   Need for influenza vaccination 11/20/2018   Onychomycosis 11/20/2018   Prostatitis 10/10/2018   Mixed dyslipidemia 09/13/2017   Elevated LFTs 09/13/2017   Microalbuminuria 09/13/2017   Polyp of colon 09/13/2017   Hypertension associated with type 2 diabetes mellitus ([place]) 06/21/2017   Hyperlipidemia 06/21/2017   Vaccine counseling 06/21/2017   Need for pneumococcal vaccination 06/21/2017    Past Surgical History:  Procedure Laterality Date   HERNIA REPAIR     KIDNEY STONE SURGERY         Home Medications    Prior to Admission medications   Medication Sig Start Date End Date Taking? Authorizing Provider  amoxicillin-clavulanate (AUGMENTIN) 875-125 MG tablet Take 1 tablet by mouth every 12 (twelve) hours for 10 days. 03/17/21 03/27/21 Yes Laquida Cotrell, Derry Skill, PA-C  BD PEN NEEDLE NANO 2ND GEN 32G X 4 MM MISC 1 EACH BY DOES NOT APPLY ROUTE AT BEDTIME. 02/08/21   Tysinger, Camelia Eng, PA-C  lisinopril (ZESTRIL) 20 MG tablet TAKE 1 TABLET BY MOUTH EVERY DAY 03/02/21   Tysinger, Camelia Eng, PA-C  metFORMIN (GLUCOPHAGE) 850 MG tablet TAKE 1 TABLET (850 MG TOTAL) BY MOUTH 2 (TWO) TIMES DAILY WITH A MEAL. 02/15/21   Tysinger, Camelia Eng, PA-C  rosuvastatin (CRESTOR) 20 MG tablet TAKE 1 TABLET BY MOUTH EVERY DAY 02/15/21   Tysinger, Camelia Eng, PA-C  TRESIBA FLEXTOUCH 100 UNIT/ML FlexTouch Pen INJECT 25UNITS INTO THE SKIN DAILY AT 10PM 12/28/20   Tysinger, Camelia Eng, PA-C    Family History Family History  Problem Relation Age of Onset   Cancer Mother        breast   Cancer Paternal Grandmother        colon    Social History Social History   Tobacco Use   Smoking status: Former    Types: Cigars   Smokeless tobacco: Never  Substance Use Topics   Alcohol use:  Yes    Comment: once a month   Drug use: Never     Allergies   Doxycycline   Review of Systems Review of Systems  Constitutional:  Positive for activity change and fever (Subjective). Negative for appetite change and fatigue.  Respiratory:  Negative for cough and shortness of breath.   Cardiovascular:  Negative for chest pain.  Gastrointestinal:  Positive for abdominal pain and diarrhea. Negative for blood in stool, constipation, nausea and vomiting.  Genitourinary:  Negative for decreased urine volume, dysuria and urgency.  Neurological:  Negative for dizziness, light-headedness and headaches.    Physical Exam Triage Vital Signs ED Triage Vitals  Enc Vitals Group     BP 03/17/21 1652 132/70     Pulse Rate 03/17/21 1652 73     Resp 03/17/21 1652 18     Temp 03/17/21 1652 98.1 F (36.7 C)     Temp Source 03/17/21 1652 Oral     SpO2 03/17/21 1652 99 %     Weight --      Height --      Head Circumference --      Peak Flow --      Pain Score 03/17/21 1653 3     Pain Loc --      Pain Edu? --      Excl. in [place]? --    No data found.  Updated Vital Signs BP 132/70 (BP Location: Left Arm)    Pulse 73    Temp 98.1 F (36.7 C) (Oral)    Resp 18    SpO2 99%   Visual Acuity Right Eye Distance:   Left Eye Distance:   Bilateral Distance:    Right Eye Near:   Left Eye Near:    Bilateral Near:     Physical Exam Vitals reviewed.  Constitutional:      General: He is awake.     Appearance: Normal appearance. He is well-developed. He is not ill-appearing.     Comments: Very pleasant male appears stated age in no acute distress sitting comfortably in exam room  HENT:     Head: Normocephalic and atraumatic.     Mouth/Throat:     Mouth: Mucous membranes are moist.     Pharynx: Uvula midline. No oropharyngeal exudate, posterior oropharyngeal erythema or uvula swelling.  Cardiovascular:     Rate and Rhythm: Normal rate and regular rhythm.     Heart sounds: Normal heart  sounds, S1 normal and S2 normal. No murmur heard. Pulmonary:     Effort: Pulmonary effort is normal.     Breath sounds: Normal breath sounds. No stridor. No wheezing, rhonchi or rales.     Comments: Clear auscultation bilaterally Abdominal:     General: Bowel sounds are normal.     Palpations: Abdomen is soft.  Tenderness: There is abdominal tenderness in the left lower quadrant. There is no right CVA tenderness, left CVA tenderness, guarding or rebound.     Comments: Mild tenderness palpation in left lower quadrant.  No evidence of acute abdomen on physical exam.  Neurological:     Mental Status: He is alert.  Psychiatric:        Behavior: Behavior is cooperative.     UC Treatments / Results  Labs (all labs ordered are listed, but only abnormal results are displayed) Labs Reviewed  CBC WITH DIFFERENTIAL/PLATELET  COMPREHENSIVE METABOLIC PANEL    EKG   Radiology No results found.  Procedures Procedures (including critical care time)  Medications Ordered in UC Medications - No data to display  Initial Impression / Assessment and Plan / UC Course  I have reviewed the triage vital signs and the nursing notes.  Pertinent labs & imaging results that were available during my care of the patient were reviewed by me and considered in my medical decision making (see chart for details).     Vital signs and physical exam reassuring today; no indication for emergent evaluation or imaging.  Given clinical presentation will cover for diverticulitis.  Discussed that the best way to evaluate for this condition is with a CT scan but we do not have these capabilities in urgent care.  He was given Augmentin with instruction take this twice daily for 10 days.  Recommended he follow-up with a GI specialist and was given contact information for local provider.  Recommended bowel rest by drinking plenty of fluid and eating a very bland diet.  CBC and CMP were obtained today and if he has  significant leukocytosis or other abnormalities he will need to go to the emergency room.  Discussed that if he has any worsening symptoms including severe abdominal pain, nausea/vomiting, blood in stool, fever, weakness he needs to go to the ER.  Strict return precautions given to which he expressed understanding.  Final Clinical Impressions(s) / UC Diagnoses   Final diagnoses:  LLQ abdominal pain  Loose stools  Change in bowel habit     Discharge Instructions      We are going to treat you for diverticulitis.  Start Augmentin twice daily for 10 days.  Drink plenty of fluid and eat a bland diet.  Avoid spicy/acidic/fatty foods.  We will contact you if your lab work is abnormal.  Please call schedule an appointment with a stomach specialist (gastroenterology).  If symptoms or not improving or worsening in any way including severe abdominal pain, nausea/vomiting, blood in your stool, fever, you need to go to the emergency room.     ED Prescriptions     Medication Sig Dispense Auth. Provider   amoxicillin-clavulanate (AUGMENTIN) 875-125 MG tablet Take 1 tablet by mouth every 12 (twelve) hours for 10 days. 20 tablet Darnetta Kesselman, Derry Skill, PA-C      PDMP not reviewed this encounter.   Terrilee Croak, PA-C 03/17/21 1715

## 2021-03-17 NOTE — ED Triage Notes (Addendum)
Pt c/o LLQ pain with diarrhea since Sunday. States after eating at a diner on Thursday and developed upper abdominal pain with fever that lasted 2 days. States LLQ pain is worse after eating.

## 2021-03-17 NOTE — Discharge Instructions (Addendum)
We are going to treat you for diverticulitis.  Start Augmentin twice daily for 10 days.  Drink plenty of fluid and eat a bland diet.  Avoid spicy/acidic/fatty foods.  We will contact you if your lab work is abnormal.  Please call schedule an appointment with a stomach specialist (gastroenterology).  If symptoms or not improving or worsening in any way including severe abdominal pain, nausea/vomiting, blood in your stool, fever, you need to go to the emergency room.

## 2021-03-26 ENCOUNTER — Other Ambulatory Visit: Payer: Self-pay

## 2021-03-26 ENCOUNTER — Emergency Department (HOSPITAL_COMMUNITY)
Admission: EM | Admit: 2021-03-26 | Discharge: 2021-03-26 | Disposition: A | Discharge status: Home / Self Care | Payer: Medicare Other | Attending: Emergency Medicine | Admitting: Emergency Medicine

## 2021-03-26 ENCOUNTER — Emergency Department (HOSPITAL_COMMUNITY): Payer: Medicare Other

## 2021-03-26 ENCOUNTER — Encounter (HOSPITAL_COMMUNITY): Payer: Self-pay

## 2021-03-26 DIAGNOSIS — R000 Tachycardia, unspecified: Secondary | ICD-10-CM | POA: Insufficient documentation

## 2021-03-26 DIAGNOSIS — E119 Type 2 diabetes mellitus without complications: Secondary | ICD-10-CM | POA: Insufficient documentation

## 2021-03-26 DIAGNOSIS — I10 Essential (primary) hypertension: Secondary | ICD-10-CM | POA: Diagnosis not present

## 2021-03-26 DIAGNOSIS — R109 Unspecified abdominal pain: Secondary | ICD-10-CM | POA: Diagnosis present

## 2021-03-26 DIAGNOSIS — D72829 Elevated white blood cell count, unspecified: Secondary | ICD-10-CM | POA: Insufficient documentation

## 2021-03-26 LAB — CBC WITH DIFFERENTIAL/PLATELET
Abs Immature Granulocytes: 0.04 10*3/uL (ref 0.00–0.07)
Basophils Absolute: 0.1 10*3/uL (ref 0.0–0.1)
Basophils Relative: 1 %
Eosinophils Absolute: 0.4 10*3/uL (ref 0.0–0.5)
Eosinophils Relative: 3 %
HCT: 34.9 % — ABNORMAL LOW (ref 39.0–52.0)
Hemoglobin: 12.5 g/dL — ABNORMAL LOW (ref 13.0–17.0)
Immature Granulocytes: 0 %
Lymphocytes Relative: 21 %
Lymphs Abs: 2.3 10*3/uL (ref 0.7–4.0)
MCH: 31.3 pg (ref 26.0–34.0)
MCHC: 35.8 g/dL (ref 30.0–36.0)
MCV: 87.3 fL (ref 80.0–100.0)
Monocytes Absolute: 1.2 10*3/uL — ABNORMAL HIGH (ref 0.1–1.0)
Monocytes Relative: 11 %
Neutro Abs: 6.9 10*3/uL (ref 1.7–7.7)
Neutrophils Relative %: 64 %
Platelets: 284 10*3/uL (ref 150–400)
RBC: 4 MIL/uL — ABNORMAL LOW (ref 4.22–5.81)
RDW: 12.1 % (ref 11.5–15.5)
WBC: 10.8 10*3/uL — ABNORMAL HIGH (ref 4.0–10.5)
nRBC: 0 % (ref 0.0–0.2)

## 2021-03-26 LAB — URINALYSIS, ROUTINE W REFLEX MICROSCOPIC
Bacteria, UA: NONE SEEN
Bilirubin Urine: NEGATIVE
Glucose, UA: NEGATIVE mg/dL
Hgb urine dipstick: NEGATIVE
Ketones, ur: 5 mg/dL — AB
Leukocytes,Ua: NEGATIVE
Nitrite: NEGATIVE
Protein, ur: 30 mg/dL — AB
Specific Gravity, Urine: 1.013 (ref 1.005–1.030)
pH: 6 (ref 5.0–8.0)

## 2021-03-26 LAB — COMPREHENSIVE METABOLIC PANEL
ALT: 26 U/L (ref 0–44)
AST: 17 U/L (ref 15–41)
Albumin: 4.5 g/dL (ref 3.5–5.0)
Alkaline Phosphatase: 45 U/L (ref 38–126)
Anion gap: 9 (ref 5–15)
BUN: 15 mg/dL (ref 8–23)
CO2: 25 mmol/L (ref 22–32)
Calcium: 9.3 mg/dL (ref 8.9–10.3)
Chloride: 97 mmol/L — ABNORMAL LOW (ref 98–111)
Creatinine, Ser: 0.88 mg/dL (ref 0.61–1.24)
GFR, Estimated: >60 mL/min (ref >60)
Glucose, Bld: 190 mg/dL — ABNORMAL HIGH (ref 70–99)
Potassium: 3.7 mmol/L (ref 3.5–5.1)
Sodium: 131 mmol/L — ABNORMAL LOW (ref 135–145)
Total Bilirubin: 1.2 mg/dL (ref 0.3–1.2)
Total Protein: 7.4 g/dL (ref 6.5–8.1)

## 2021-03-26 LAB — LIPASE, BLOOD: Lipase: 28 U/L (ref 11–51)

## 2021-03-26 MED ORDER — IOHEXOL 300 MG/ML  SOLN
100.0000 mL | Freq: Once | INTRAMUSCULAR | Status: AC | PRN
  Administered 2021-03-26: 100 mL via INTRAVENOUS

## 2021-03-26 MED ORDER — SODIUM CHLORIDE (PF) 0.9 % IJ SOLN
INTRAMUSCULAR | Status: AC
  Filled 2021-03-26: qty 50

## 2021-03-26 MED ORDER — SODIUM CHLORIDE 0.9 % IV BOLUS
500.0000 mL | Freq: Once | INTRAVENOUS | Status: AC
  Administered 2021-03-26: 500 mL via INTRAVENOUS

## 2021-03-26 NOTE — Discharge Instructions (Signed)
If you continue to have diarrhea at the end the next week please consider sending off stool sample with your primary care doctor.

## 2021-03-26 NOTE — ED Notes (Signed)
Patient transported to CT 

## 2021-03-26 NOTE — ED Triage Notes (Signed)
Pt arrives EMS from home with c/o LLQ abdominal pain on-going since 1/4 encounter. Diarrhea has resolved.

## 2021-03-26 NOTE — ED Provider Notes (Signed)
Emergency Department Provider Note   I have reviewed the triage vital signs and the nursing notes.   HISTORY  Chief Complaint Abdominal Pain   HPI Christopher Gay is a 66 y.o. male with past medical history of diabetes, hypertension, hyperlipidemia presents emergency department with continued left sided abdominal pain.  Symptoms began 10 days ago with abrupt onset diarrhea and pain which was nonbloody.  He went to urgent care and was empirically treated for diverticulitis but no imaging to confirm.  He completed around 5 days of antibiotic but stopped early due to cramping in the legs and continued abdominal pain.  He awoke with worsening pain this evening along with some chills.  He was given IV fluids with EMS and felt somewhat better after that.  He has not had continued vomiting or diarrhea.    Past Medical History:  Diagnosis Date   Arthritis    BPH without urinary obstruction    Diabetes mellitus without complication ([place]) [id number]   diagnosed in [place]   Hyperlipidemia    Hypertension     prior on medication year ago   Kidney stone     Review of Systems  Constitutional: No fever/chills Eyes: No visual changes. ENT: No sore throat. Cardiovascular: Denies chest pain. Respiratory: Denies shortness of breath. Gastrointestinal: Positive left sided abdominal pain.  No nausea, no vomiting.  No diarrhea.  No constipation. Genitourinary: Negative for dysuria. Musculoskeletal: Negative for back pain. Skin: Negative for rash. Neurological: Negative for headaches, focal weakness or numbness.  10-point ROS otherwise negative.  ____________________________________________   PHYSICAL EXAM:  VITAL SIGNS: ED Triage Vitals  Enc Vitals Group     BP 03/26/21 0302 129/81     Pulse Rate 03/26/21 0302 (!) 110     Resp 03/26/21 0302 16     Temp 03/26/21 0303 98.9 F (37.2 C)     Temp Source 03/26/21 0303 Oral     SpO2 03/26/21 0302 99 %     Weight 03/26/21 0308 195 lb (88.5 kg)      Height 03/26/21 0308 5\' 10"  (1.778 m)   Constitutional: Alert and oriented. Well appearing and in no acute distress. Eyes: Conjunctivae are normal. Head: Atraumatic. Nose: No congestion/rhinnorhea. Mouth/Throat: Mucous membranes are moist.   Neck: No stridor.  Cardiovascular: Tachycardia. Good peripheral circulation. Grossly normal heart sounds.   Respiratory: Normal respiratory effort.  No retractions. Lungs CTAB. Gastrointestinal: Soft and nontender. No distention.  Musculoskeletal: No lower extremity tenderness nor edema. No gross deformities of extremities. Neurologic:  Normal speech and language. No gross focal neurologic deficits are appreciated.  Skin:  Skin is warm, dry and intact. No rash noted.   ____________________________________________   LABS (all labs ordered are listed, but only abnormal results are displayed)  Labs Reviewed  COMPREHENSIVE METABOLIC PANEL - Abnormal; Notable for the following components:      Result Value   Sodium 131 (*)    Chloride 97 (*)    Glucose, Bld 190 (*)    All other components within normal limits  URINALYSIS, ROUTINE W REFLEX MICROSCOPIC - Abnormal; Notable for the following components:   Ketones, ur 5 (*)    Protein, ur 30 (*)    All other components within normal limits  CBC WITH DIFFERENTIAL/PLATELET - Abnormal; Notable for the following components:   WBC 10.8 (*)    RBC 4.00 (*)    Hemoglobin 12.5 (*)    HCT 34.9 (*)    Monocytes Absolute 1.2 (*)  All other components within normal limits  LIPASE, BLOOD   ____________________________________________  RADIOLOGY  CT abdomen/pelvis pending.   ____________________________________________   PROCEDURES  Procedure(s) performed:   Procedures  None  ____________________________________________   INITIAL IMPRESSION / ASSESSMENT AND PLAN / ED COURSE  Pertinent labs & imaging results that were available during my care of the patient were reviewed by me and  considered in my medical decision making (see chart for details).   This patient is Presenting for Evaluation of abdominal pain, which does require a range of treatment options, and is a complaint that involves a high risk of morbidity and mortality.  The Differential Diagnoses includes but is not exclusive to acute appendicitis, renal colic, testicular torsion, urinary tract infection, prostatitis,  diverticulitis, small bowel obstruction, colitis, abdominal aortic aneurysm, gastroenteritis, constipation etc.   Critical Interventions- Pain medication and IVF along with labs and CT abdomen/pelvis imaging.    Reassessment after intervention: Pain improved. Vitals remain WNL.    I did Additional Historical Information from wife at bedside. Confirms recent treatment course and symptom timeline.   I decided to review pertinent External Data, and in summary urgent care visit from the 4th of January describes empiric treatment for presumed diverticulitis w/ Augmentin.   Clinical Laboratory Tests Ordered, included patient with very mild leukocytosis at 10.8.  Hemoglobin at 12.5.  No acute kidney injury or electrolyte disturbance.  UA without evidence of infection.  Lipase is normal.  Radiologic Tests Ordered, included CT abdomen/pelvis pending.   Cardiac Monitor Tracing which shows NSR.   Medical Decision Making: Summary:  Patient presents with left abdominal pain in the setting of partial treatment for presumed diverticulitis.  Patient stopped his Augmentin early with some cramping in the legs.  No symptoms to suggest allergic type reaction or allergy.  Lab work reviewed as above is reassuring and patient appears well-hydrated.  Question either a partially treated diverticulitis or some other complication such as abscess versus microperforation although exam is overall reassuring.  Care transferred to Dr. Ronnald Nian pending CT imaging.   Disposition: pending    ____________________________________________  FINAL CLINICAL IMPRESSION(S) / ED DIAGNOSES  Final diagnoses:  Abdominal pain, unspecified abdominal location     MEDICATIONS GIVEN DURING THIS VISIT:  Medications  sodium chloride 0.9 % bolus 500 mL (0 mLs Intravenous Stopped 03/26/21 0512)  iohexol (OMNIPAQUE) 300 MG/ML solution 100 mL (100 mLs Intravenous Contrast Given 03/26/21 0656)     Note:  This document was prepared using Dragon voice recognition software and may include unintentional dictation errors.  Nanda Quinton, MD, [facility] Emergency Medicine    Sanii Kukla, Wonda Olds, MD 03/27/21 [phone]

## 2021-03-26 NOTE — ED Notes (Signed)
Helped pt ambulate to bathroom 

## 2021-03-26 NOTE — ED Provider Notes (Signed)
CT scan is unremarkable.  Discharged in good condition.  Likely resolving diverticulitis.   Lennice Sites, DO 03/26/21 [phone]

## 2021-03-29 ENCOUNTER — Other Ambulatory Visit: Payer: Self-pay

## 2021-03-29 ENCOUNTER — Ambulatory Visit (INDEPENDENT_AMBULATORY_CARE_PROVIDER_SITE_OTHER): Payer: Medicare Other | Admitting: Medical

## 2021-03-29 VITALS — BP 120/70 | HR 76 | Temp 97.4°F | Ht 70.0 in | Wt 179.0 lb

## 2021-03-29 DIAGNOSIS — R197 Diarrhea, unspecified: Secondary | ICD-10-CM

## 2021-03-29 DIAGNOSIS — E1165 Type 2 diabetes mellitus with hyperglycemia: Secondary | ICD-10-CM

## 2021-03-29 DIAGNOSIS — R1032 Left lower quadrant pain: Secondary | ICD-10-CM

## 2021-03-29 MED ORDER — TRESIBA FLEXTOUCH 100 UNIT/ML ~~LOC~~ SOPN: PEN_INJECTOR | SUBCUTANEOUS | 1 refills | Status: DC

## 2021-03-29 MED ORDER — METFORMIN HCL ER 500 MG PO TB24: 500.0000 mg | ORAL_TABLET | Freq: Two times a day (BID) | ORAL | 0 refills | Status: DC

## 2021-03-29 NOTE — Patient Instructions (Signed)
Belly pain and diarrhea Fortunately your symptoms have mostly resolved Over the next week I would limit foods that make you gassy or upset stomach such as greasy foods, onions, big portions or other If you want to take a few days and do meal replacement shakes such as Ensure or boost, then that is fine I do recommend you use a probiotic over the next week or 2.  Over-the-counter examples would be align, IBgard If you start having other new or worsening symptoms then call back He did have a kidney stone noted on your scan.  He did not have the right symptoms to suggest kidney stone though. If you start having significant severe abdominal pain that tends to move downwards towards your pelvis, typically associated with blood in the stool or just severe pain that cannot be comforted, then recheck I do recommend we change your metformin to 500 mg twice a day as you may be having some issues with this medication given your gastric symptoms Since we are going to change the metformin I need you to increase your Tresiba to 28 units daily.  Monitor your blood sugars.  If your blood sugars are starting to run greater than 130 fasting, then you can increase Tresiba 2 units every few days to get the sugar down to an acceptable level of less than 130 fasting If you continue to lose weight over the next 2 weeks to let me know

## 2021-03-29 NOTE — Progress Notes (Signed)
Subjective:  Christopher Gay is a 66 y.o. male who presents for Chief Complaint  Patient presents with   Hospitalization Follow-up    Hospital follow-up. Was 196 and today is 179. No more abdominal pain.      Here for emergency dept f/u.   Went to emergency dept twice recently.  To urgent care on January 4 daily for left lower quadrant belly pain.  He was empirically started on antibiotics Augmentin for diverticulitis.  He continued to have pain and diarrhea.  He never had bloody stool.  Never had fever.  Never had nausea or vomiting.  Never had back pain.  Went back to be checked out this time at the emergency department.  03/26/21 went to ED for ongoing left sided abdominal pain.   At that time had 10 days of symptoms, LLQ pain, abrupt diarrhea, non bloody.  At the time of this visit he was on day 5 of antibiotic for empiric diverticulitis.  Stooped the antibiotic due to cramping and pain.   Had IV fluids with EMS.   Never had nausea or vomiting but had several days of loose stools.   Never had bloody stool.  Pain resolved the day after hospital visit.     Feels like Christopher Gay is making him sick feeling.  Not sure if its this medication or other.  Currently using Tresiba 25u  He did start Probiotic, [facility] brand  No other aggravating or relieving factors.    No other c/o.  Past Medical History:  Diagnosis Date   Arthritis    BPH without urinary obstruction    Diabetes mellitus without complication (HCC) 2000   diagnosed in [place]   Hyperlipidemia    Hypertension     prior on medication year ago   Kidney stone    Current Outpatient Medications on File Prior to Visit  Medication Sig Dispense Refill   lisinopril (ZESTRIL) 20 MG tablet TAKE 1 TABLET BY MOUTH EVERY DAY 30 tablet 3   rosuvastatin (CRESTOR) 20 MG tablet TAKE 1 TABLET BY MOUTH EVERY DAY 90 tablet 0   BD PEN NEEDLE NANO 2ND GEN 32G X 4 MM MISC 1 EACH BY DOES NOT APPLY ROUTE AT BEDTIME. 100 each 1   No current  facility-administered medications on file prior to visit.     The following portions of the patient's history were reviewed and updated as appropriate: allergies, current medications, past family history, past medical history, past social history, past surgical history and problem list.  ROS Otherwise as in subjective above  Objective: BP 120/70    Pulse 76    Temp (!) 97.4 F (36.3 C)    Ht 5\' 10"  (1.778 m)    Wt 179 lb (81.2 kg)    SpO2 98%    BMI 25.68 kg/m   Wt Readings from Last 3 Encounters:  03/29/21 179 lb (81.2 kg)  03/26/21 195 lb (88.5 kg)  02/23/21 194 lb 12.8 oz (88.4 kg)    General appearance: alert, no distress, well developed, well nourished Abdomen: +bs, soft, non tender, non distended, no masses, no hepatomegaly, no splenomegaly Pulses: 2+ radial pulses, 2+ pedal pulses, normal cap refill Ext: no edema    Assessment: Encounter Diagnoses  Name Primary?   LLQ abdominal pain Yes   Diarrhea, unspecified type    Uncontrolled type 2 diabetes mellitus with hyperglycemia (HCC)      Plan: Reviewed his recent urgent care and emergency department notes.  I reviewed labs and CT abdomen pelvis  scan he had done 03/26/21.  The scan did not show signs of diverticulitis.  Labs did show a kidney stone which is likely not the cause of his symptoms.  I reviewed labs he had done as well.  Elevated white count.  I suspect he may have had a bowel infection more likely than diverticulitis.  He has already stopped Augmentin.  His symptoms have resolved but he is concerned about the weight loss.  He may have had some prior problems with metformin general GI concerns.  I will adjust his metformin and Tresiba dosing today.  He was instructed to recheck if he continues to lose weight.  We discussed the following recommendations:  Patient Instructions  Belly pain and diarrhea Fortunately your symptoms have mostly resolved Over the next week I would limit foods that make you  gassy or upset stomach such as greasy foods, onions, big portions or other If you want to take a few days and do meal replacement shakes such as Ensure or boost, then that is fine I do recommend you use a probiotic over the next week or 2.  Over-the-counter examples would be align, IBgard If you start having other new or worsening symptoms then call back He did have a kidney stone noted on your scan.  He did not have the right symptoms to suggest kidney stone though. If you start having significant severe abdominal pain that tends to move downwards towards your pelvis, typically associated with blood in the stool or just severe pain that cannot be comforted, then recheck I do recommend we change your metformin to 500 mg twice a day as you may be having some issues with this medication given your gastric symptoms Since we are going to change the metformin I need you to increase your Tresiba to 28 units daily.  Monitor your blood sugars.  If your blood sugars are starting to run greater than 130 fasting, then you can increase Tresiba 2 units every few days to get the sugar down to an acceptable level of less than 130 fasting If you continue to lose weight over the next 2 weeks to let me know   Christopher Gay was seen today for hospitalization follow-up.  Diagnoses and all orders for this visit:  LLQ abdominal pain  Diarrhea, unspecified type  Uncontrolled type 2 diabetes mellitus with hyperglycemia (HCC)  Other orders -     metFORMIN (GLUCOPHAGE XR) 500 MG 24 hr tablet; Take 1 tablet (500 mg total) by mouth in the morning and at bedtime. -     insulin degludec (TRESIBA FLEXTOUCH) 100 UNIT/ML FlexTouch Pen; INJECT 28 UNITS INTO THE SKIN DAILY AT 10PM    Follow up: 4-6 weeks

## 2021-05-11 ENCOUNTER — Ambulatory Visit: Payer: Medicare Other | Admitting: Medical

## 2021-05-17 ENCOUNTER — Other Ambulatory Visit: Payer: Self-pay | Admitting: Medical

## 2021-05-28 ENCOUNTER — Other Ambulatory Visit: Payer: Self-pay | Admitting: Medical

## 2021-06-01 ENCOUNTER — Ambulatory Visit: Payer: Medicare Other | Admitting: Medical

## 2021-06-01 ENCOUNTER — Other Ambulatory Visit: Payer: Self-pay

## 2021-06-01 MED ORDER — METFORMIN HCL ER 500 MG PO TB24: 500.0000 mg | ORAL_TABLET | Freq: Two times a day (BID) | ORAL | 0 refills | Status: DC

## 2021-06-20 NOTE — Progress Notes (Signed)
?  Subjective:  ? ? Patient ID: Christopher Gay, male    DOB: February 18, 1956, 66 y.o.   MRN: [id number] ? ?Christopher Gay is a 66 y.o. male who presents for follow-up of Type 2 diabetes mellitus. ? ?Home blood sugar records:  fasting and post meal lowest reading 74 highest 220 ?Current symptoms/problems include none and have been stable. ?Daily foot checks:yes  Any foot concerns: none  ?Exercise:  qd on treadmill got 30 min  ?Diet:good ?He continues on metformin and having no difficulty with that.  He is also taking lisinopril and Crestor without difficulty.  He admits to his eating habits being less than adequate. ?The following portions of the patient's history were reviewed and updated as appropriate: allergies, current medications, past medical history, past social history and problem list. ? ?ROS as in subjective above. ? ?   ?Objective:  ?  ?Physical Exam ?Alert and in no distress otherwise not examined. ?Hb A1c 8.0 ? ?Lab Review ? ?  Latest Ref Rng & Units 03/26/2021  ?  3:07 AM 03/17/2021  ?  5:09 PM 02/23/2021  ?  9:41 AM 02/23/2021  ?  9:31 AM 09/21/2020  ?  2:40 PM  ?Diabetic Labs  ?HbA1c 4.0 - 5.6 %    7.9     ?Micro/Creat Ratio 0 - 29 mg/g creat   129      ?Chol 100 - 199 mg/dL   388      ?HDL >39 mg/dL   36      ?Calc LDL 0 - 99 mg/dL   56      ?Triglycerides 0 - 149 mg/dL   97      ?Creatinine 0.61 - 1.24 mg/dL 8.28   0.03     4.91    ? ? ?  03/29/2021  ?  8:50 AM 03/26/2021  ?  8:00 AM 03/26/2021  ?  7:40 AM 03/26/2021  ?  6:03 AM 03/26/2021  ?  6:00 AM  ?BP/Weight  ?Systolic BP 120 125 134 124 124  ?Diastolic BP 70 79 95 77 77  ?Wt. (Lbs) 179      ?BMI 25.68 kg/m2      ? ? ?  02/23/2021  ?  9:30 AM 04/15/2020  ? 11:00 AM  ?Foot/eye exam completion dates  ?Foot Form Completion Done Done  ? ? ?[name]  reports that he has quit smoking. His smoking use included cigars. He has never used smokeless tobacco. He reports current alcohol use. He reports that he does not use drugs. ? ?   ?Assessment & Plan:  ?  ?Type II diabetes mellitus  with complication (HCC) - Plan: HgB A1c ? ?Uncontrolled type 2 diabetes mellitus with hyperglycemia (HCC) - Plan: HgB A1c ? ?Hypertension associated with type 2 diabetes mellitus (HCC) ? ?Hyperlipidemia associated with type 2 diabetes mellitus (HCC) ?Increased your insulin by 2 units every 2 days until your morning blood sugar is below 120 ?More frequent but smaller meals ? ? ? ? ? ?  ?

## 2021-06-21 ENCOUNTER — Encounter: Payer: Self-pay | Admitting: Family Medicine

## 2021-06-21 ENCOUNTER — Ambulatory Visit (INDEPENDENT_AMBULATORY_CARE_PROVIDER_SITE_OTHER): Payer: Medicare Other | Admitting: Family Medicine

## 2021-06-21 VITALS — BP 120/70 | HR 76 | Ht 70.0 in | Wt 192.8 lb

## 2021-06-21 DIAGNOSIS — E1165 Type 2 diabetes mellitus with hyperglycemia: Secondary | ICD-10-CM | POA: Diagnosis not present

## 2021-06-21 DIAGNOSIS — I152 Hypertension secondary to endocrine disorders: Secondary | ICD-10-CM

## 2021-06-21 DIAGNOSIS — K635 Polyp of colon: Secondary | ICD-10-CM

## 2021-06-21 DIAGNOSIS — E118 Type 2 diabetes mellitus with unspecified complications: Secondary | ICD-10-CM

## 2021-06-21 DIAGNOSIS — N400 Enlarged prostate without lower urinary tract symptoms: Secondary | ICD-10-CM

## 2021-06-21 DIAGNOSIS — E1169 Type 2 diabetes mellitus with other specified complication: Secondary | ICD-10-CM

## 2021-06-21 DIAGNOSIS — E785 Hyperlipidemia, unspecified: Secondary | ICD-10-CM

## 2021-06-21 DIAGNOSIS — E1159 Type 2 diabetes mellitus with other circulatory complications: Secondary | ICD-10-CM

## 2021-06-21 DIAGNOSIS — R7989 Other specified abnormal findings of blood chemistry: Secondary | ICD-10-CM

## 2021-06-21 LAB — POCT GLYCOSYLATED HEMOGLOBIN (HGB A1C): Hemoglobin A1C: 8.8 % — AB (ref 4.0–5.6)

## 2021-06-21 NOTE — Patient Instructions (Addendum)
Increased your insulin by 2 units every 2 days until your morning blood sugar is below 120 ?More frequent but smaller meals ?

## 2021-06-24 ENCOUNTER — Other Ambulatory Visit (INDEPENDENT_AMBULATORY_CARE_PROVIDER_SITE_OTHER): Payer: Medicare Other

## 2021-06-24 DIAGNOSIS — Z23 Encounter for immunization: Secondary | ICD-10-CM

## 2021-07-12 ENCOUNTER — Other Ambulatory Visit: Payer: Self-pay | Admitting: Medical

## 2021-08-14 ENCOUNTER — Other Ambulatory Visit: Payer: Self-pay | Admitting: Medical

## 2021-09-16 ENCOUNTER — Other Ambulatory Visit: Payer: Self-pay | Admitting: Medical

## 2021-10-28 ENCOUNTER — Ambulatory Visit: Payer: Medicare Other | Admitting: Medical

## 2021-11-17 ENCOUNTER — Encounter: Payer: Self-pay | Admitting: Internal Medicine

## 2021-11-18 ENCOUNTER — Other Ambulatory Visit: Payer: Self-pay | Admitting: Medical

## 2021-11-18 NOTE — Telephone Encounter (Signed)
Has upcoming appointment soon 

## 2021-11-19 ENCOUNTER — Telehealth: Payer: Self-pay | Admitting: Medical

## 2021-11-19 NOTE — Telephone Encounter (Signed)
Left message for patient to call back and schedule Medicare Annual Wellness Visit (AWV) either virtually or in office. I left my number for patient to call [phone].  awvi 10/12/21 per [facility]   please schedule at anytime with health coach  This should be a 45 minute visit.

## 2021-11-26 ENCOUNTER — Ambulatory Visit (INDEPENDENT_AMBULATORY_CARE_PROVIDER_SITE_OTHER): Payer: Medicare Other

## 2021-11-26 VITALS — BP 136/80 | HR 87 | Temp 97.9°F | Ht 70.0 in | Wt 193.8 lb

## 2021-11-26 DIAGNOSIS — Z0000 Encounter for general adult medical examination without abnormal findings: Secondary | ICD-10-CM | POA: Diagnosis not present

## 2021-11-26 NOTE — Progress Notes (Signed)
Subjective:   Nazaire Cordial is a 66 y.o. male who presents for an Initial Medicare Annual Wellness Visit.  Review of Systems     Cardiac Risk Factors include: advanced age (>40men, >75 women);diabetes mellitus;dyslipidemia;hypertension;male gender     Objective:    Today's Vitals   11/26/21 1344  BP: 136/80  Pulse: 87  Temp: 97.9 F (36.6 C)  TempSrc: Oral  SpO2: 96%  Weight: 193 lb 12.8 oz (87.9 kg)  Height: 5\' 10"  (1.778 m)   Body mass index is 27.81 kg/m.     11/26/2021    1:54 PM 03/26/2021    3:07 AM 09/21/2020    2:28 PM  Advanced Directives  Does Patient Have a Medical Advance Directive? No No No  Would patient like information on creating a medical advance directive? No - Patient declined No - Patient declined No - Guardian declined    Current Medications (verified) Outpatient Encounter Medications as of 11/26/2021  Medication Sig   BD PEN NEEDLE NANO 2ND GEN 32G X 4 MM MISC 1 EACH BY DOES NOT APPLY ROUTE AT BEDTIME.   lisinopril (ZESTRIL) 20 MG tablet TAKE 1 TABLET BY MOUTH EVERY DAY   metFORMIN (GLUCOPHAGE-XR) 500 MG 24 hr tablet TAKE 1 TABLET (500 MG TOTAL) BY MOUTH IN THE MORNING AND AT BEDTIME   rosuvastatin (CRESTOR) 20 MG tablet TAKE 1 TABLET BY MOUTH EVERY DAY   TRESIBA FLEXTOUCH 100 UNIT/ML FlexTouch Pen INJECT 28 UNITS INTO THE SKIN DAILY AT 10PM   No facility-administered encounter medications on file as of 11/26/2021.    Allergies (verified) Doxycycline   History: Past Medical History:  Diagnosis Date   Arthritis    BPH without urinary obstruction    Diabetes mellitus without complication (HCC) 2000   diagnosed in 11/28/2021   Hyperlipidemia    Hypertension     prior on medication year ago   Kidney stone    Past Surgical History:  Procedure Laterality Date   HERNIA REPAIR     KIDNEY STONE SURGERY     Family History  Problem Relation Age of Onset   Cancer Mother        breast   Cancer Paternal Grandmother        colon   Social  History   Socioeconomic History   Marital status: Married    Spouse name: Not on file   Number of children: Not on file   Years of education: Not on file   Highest education level: Not on file  Occupational History   Not on file  Tobacco Use   Smoking status: Former    Types: Cigars   Smokeless tobacco: Never  Vaping Use   Vaping Use: Never used  Substance and Sexual Activity   Alcohol use: Not Currently    Comment: no alcohol in 3-4 months   Drug use: Never   Sexual activity: Not on file  Other Topics Concern   Not on file  Social History Narrative   Not on file   Social Determinants of Health   Financial Resource Strain: Low Risk  (11/26/2021)   Overall Financial Resource Strain (CARDIA)    Difficulty of Paying Living Expenses: Not hard at all  Food Insecurity: No Food Insecurity (11/26/2021)   Hunger Vital Sign    Worried About Running Out of Food in the Last Year: Never true    Ran Out of Food in the Last Year: Never true  Transportation Needs: No Transportation Needs (11/26/2021)   PRAPARE -  Administrator, Civil Service (Medical): No    Lack of Transportation (Non-Medical): No  Physical Activity: Sufficiently Active (11/26/2021)   Exercise Vital Sign    Days of Exercise per Week: 6 days    Minutes of Exercise per Session: 40 min  Stress: No Stress Concern Present (11/26/2021)   [employer] of Occupational Health - Occupational Stress Questionnaire    Feeling of Stress : Not at all  Social Connections: Not on file    Tobacco Counseling Counseling given: Not Answered   Clinical Intake:  Pre-visit preparation completed: Yes  Pain : No/denies pain     Nutritional Status: BMI 25 -29 Overweight Nutritional Risks: None Diabetes: Yes  How often do you need to have someone help you when you read instructions, pamphlets, or other written materials from your doctor or pharmacy?: 1 - Never What is the last grade level you completed in school?:  some college  Diabetic? Yes Nutrition Risk Assessment:  Has the patient had any N/V/D within the last 2 months?  No  Does the patient have any non-healing wounds?  No  Has the patient had any unintentional weight loss or weight gain?  No   Diabetes:  Is the patient diabetic?  Yes  If diabetic, was a CBG obtained today?  No  Did the patient bring in their glucometer from home?  No  How often do you monitor your CBG's? daily.   Financial Strains and Diabetes Management:  Are you having any financial strains with the device, your supplies or your medication? No .  Does the patient want to be seen by Chronic Care Management for management of their diabetes?  No  Would the patient like to be referred to a Nutritionist or for Diabetic Management?  No   Diabetic Exams:  Diabetic Eye Exam: Overdue for diabetic eye exam. Pt has been advised about the importance in completing this exam. Patient advised to call and schedule an eye exam. Diabetic Foot Exam: Completed 02/23/2021   Interpreter Needed?: No  Information entered by :: NAllen LPN   Activities of Daily Living    11/26/2021    1:55 PM  In your present state of health, do you have any difficulty performing the following activities:  Hearing? 0  Vision? 1  Comment a little with distance  Difficulty concentrating or making decisions? 0  Walking or climbing stairs? 0  Dressing or bathing? 0  Doing errands, shopping? 0  Preparing Food and eating ? N  Using the Toilet? N  In the past six months, have you accidently leaked urine? N  Do you have problems with loss of bowel control? N  Managing your Medications? N  Managing your Finances? N  Housekeeping or managing your Housekeeping? N    Patient Care Team: Tysinger, Kermit Balo, PA-C as PCP - General (Family Medicine)  Indicate any recent Medical Services you may have received from other than [facility] providers in the past year (date may be approximate).     Assessment:    This is a routine wellness examination for Vittorio.  Hearing/Vision screen Vision Screening - Comments:: No regular eye exams  Dietary issues and exercise activities discussed: Current Exercise Habits: Home exercise routine, Type of exercise: treadmill, Time (Minutes): 40, Frequency (Times/Week): 6, Weekly Exercise (Minutes/Week): 240   Goals Addressed             This Visit's Progress    Patient Stated       11/26/2021, no goals  Depression Screen    11/26/2021    1:55 PM 06/21/2021   10:32 AM 03/29/2021    8:50 AM 02/23/2021    9:14 AM 11/17/2020   10:27 AM 07/15/2020   11:48 AM 12/23/2019   10:25 AM  PHQ 2/9 Scores  PHQ - 2 Score 0 0 0 0 0 0 0  PHQ- 9 Score 0          Fall Risk    11/26/2021    1:55 PM 06/21/2021   10:32 AM 03/29/2021    8:50 AM 02/23/2021    9:14 AM 11/17/2020   10:26 AM  Fall Risk   Falls in the past year? 0 0 0 0 0  Number falls in past yr: 0 0 0 0 0  Injury with Fall? 0 0 0 0 0  Risk for fall due to : Medication side effect No Fall Risks No Fall Risks No Fall Risks No Fall Risks  Follow up Falls prevention discussed;Education provided;Falls evaluation completed Falls evaluation completed Falls evaluation completed Falls evaluation completed Falls evaluation completed    FALL RISK PREVENTION PERTAINING TO THE HOME:  Any stairs in or around the home? Yes  If so, are there any without handrails? Yes  Home free of loose throw rugs in walkways, pet beds, electrical cords, etc? Yes  Adequate lighting in your home to reduce risk of falls? Yes   ASSISTIVE DEVICES UTILIZED TO PREVENT FALLS:  Life alert? No  Use of a cane, walker or w/c? No  Grab bars in the bathroom? Yes  Shower chair or bench in shower? No  Elevated toilet seat or a handicapped toilet? No   TIMED UP AND GO:  Was the test performed? Yes .  Length of time to ambulate 10 feet: 4 sec.   Gait steady and fast without use of assistive device  Cognitive Function:         11/26/2021    1:56 PM  6CIT Screen  What Year? 0 points  What month? 0 points  What time? 0 points  Count back from 20 0 points  Months in reverse 0 points  Repeat phrase 0 points  Total Score 0 points    Immunizations Immunization History  Administered Date(s) Administered   Fluad Quad(high Dose 65+) 11/17/2020   Influenza Inj Mdck Quad Pf 11/23/2016   Influenza,inj,Quad PF,6+ Mos 12/19/2017, 11/20/2018, 11/12/2019   PFIZER Comirnaty(Gray Top)Covid-19 Tri-Sucrose Vaccine 07/15/2020   PFIZER(Purple Top)SARS-COV-2 Vaccination 05/31/2019, 06/26/2019, 12/23/2019   Pfizer Covid-19 Vaccine Bivalent Booster 68yrs & up 02/23/2021   Pneumococcal Conjugate-13 06/24/2021   Pneumococcal Polysaccharide-23 06/21/2017   Tdap 04/04/2018   Zoster Recombinat (Shingrix) 04/04/2018, 06/06/2018    TDAP status: Up to date  Flu Vaccine status: Up to date  Pneumococcal vaccine status: Up to date  Covid-19 vaccine status: Completed vaccines  Qualifies for Shingles Vaccine? Yes   Zostavax completed Yes   Shingrix Completed?: Yes  Screening Tests Health Maintenance  Topic Date Due   OPHTHALMOLOGY EXAM  03/27/2019   COVID-19 Vaccine (6 - Pfizer series) 06/24/2021   HEMOGLOBIN A1C  12/21/2021   Diabetic kidney evaluation - Urine ACR  02/23/2022   FOOT EXAM  02/23/2022   Diabetic kidney evaluation - GFR measurement  03/26/2022   Pneumonia Vaccine 79+ Years old (3 - PPSV23 or PCV20) 06/25/2022   TETANUS/TDAP  04/04/2028   COLONOSCOPY (Pts 45-68yrs Insurance coverage will need to be confirmed)  05/03/2028   INFLUENZA VACCINE  Completed   Hepatitis C Screening  Completed   Zoster Vaccines- Shingrix  Completed   HPV VACCINES  Aged Out    Health Maintenance  Health Maintenance Due  Topic Date Due   OPHTHALMOLOGY EXAM  03/27/2019   COVID-19 Vaccine (6 - Pfizer series) 06/24/2021    Colorectal cancer screening: Type of screening: Colonoscopy. Completed 05/03/2018. Repeat every 5  years  Lung Cancer Screening: (Low Dose CT Chest recommended if Age 6-80 years, 30 pack-year currently smoking OR have quit w/in 15years.) does not qualify.   Lung Cancer Screening Referral: no  Additional Screening:  Hepatitis C Screening: does qualify; Completed 12/23/2019  Vision Screening: Recommended annual ophthalmology exams for early detection of glaucoma and other disorders of the eye. Is the patient up to date with their annual eye exam?  No  Who is the provider or what is the name of the office in which the patient attends annual eye exams? none If pt is not established with a provider, would they like to be referred to a provider to establish care? No .   Dental Screening: Recommended annual dental exams for proper oral hygiene  Community Resource Referral / Chronic Care Management: CRR required this visit?  No   CCM required this visit?  No      Plan:     I have personally reviewed and noted the following in the patient's chart:   Medical and social history Use of alcohol, tobacco or illicit drugs  Current medications and supplements including opioid prescriptions. Patient is not currently taking opioid prescriptions. Functional ability and status Nutritional status Physical activity Advanced directives List of other physicians Hospitalizations, surgeries, and ER visits in previous 12 months Vitals Screenings to include cognitive, depression, and falls Referrals and appointments  In addition, I have reviewed and discussed with patient certain preventive protocols, quality metrics, and best practice recommendations. A written personalized care plan for preventive services as well as general preventive health recommendations were provided to patient.     Barb Merino, LPN   07/08/8339   Nurse Notes: none

## 2021-11-26 NOTE — Patient Instructions (Signed)
Christopher Gay , Thank you for taking time to come for your Medicare Wellness Visit. I appreciate your ongoing commitment to your health goals. Please review the following plan we discussed and let me know if I can assist you in the future.   Screening recommendations/referrals: Colonoscopy: completed 05/03/2018 Recommended yearly ophthalmology/optometry visit for glaucoma screening and checkup Recommended yearly dental visit for hygiene and checkup  Vaccinations: Influenza vaccine: completed 11/22/2021 Pneumococcal vaccine: completed 06/24/2021 Tdap vaccine: completed 04/04/2018, due 04/04/2028 Shingles vaccine: completed   Covid-19: 02/23/2021, 07/15/2020, 12/23/2019, 06/26/2019, 05/31/2019  Advanced directives: Advance directive discussed with you today. Even though you declined this today please call our office should you change your mind and we can give you the proper paperwork for you to fill out.  Conditions/risks identified: none  Next appointment: Follow up in one year for your annual wellness visit.   Preventive Care 22 Years and Older, Male Preventive care refers to lifestyle choices and visits with your health care provider that can promote health and wellness. What does preventive care include? A yearly physical exam. This is also called an annual well check. Dental exams once or twice a year. Routine eye exams. Ask your health care provider how often you should have your eyes checked. Personal lifestyle choices, including: Daily care of your teeth and gums. Regular physical activity. Eating a healthy diet. Avoiding tobacco and drug use. Limiting alcohol use. Practicing safe sex. Taking low doses of aspirin every day. Taking vitamin and mineral supplements as recommended by your health care provider. What happens during an annual well check? The services and screenings done by your health care provider during your annual well check will depend on your age, overall health, lifestyle  risk factors, and family history of disease. Counseling  Your health care provider may ask you questions about your: Alcohol use. Tobacco use. Drug use. Emotional well-being. Home and relationship well-being. Sexual activity. Eating habits. History of falls. Memory and ability to understand (cognition). Work and work Astronomer. Screening  You may have the following tests or measurements: Height, weight, and BMI. Blood pressure. Lipid and cholesterol levels. These may be checked every 5 years, or more frequently if you are over 24 years old. Skin check. Lung cancer screening. You may have this screening every year starting at age 58 if you have a 30-pack-year history of smoking and currently smoke or have quit within the past 15 years. Fecal occult blood test (FOBT) of the stool. You may have this test every year starting at age 61. Flexible sigmoidoscopy or colonoscopy. You may have a sigmoidoscopy every 5 years or a colonoscopy every 10 years starting at age 18. Prostate cancer screening. Recommendations will vary depending on your family history and other risks. Hepatitis C blood test. Hepatitis B blood test. Sexually transmitted disease (STD) testing. Diabetes screening. This is done by checking your blood sugar (glucose) after you have not eaten for a while (fasting). You may have this done every 1-3 years. Abdominal aortic aneurysm (AAA) screening. You may need this if you are a current or former smoker. Osteoporosis. You may be screened starting at age 30 if you are at high risk. Talk with your health care provider about your test results, treatment options, and if necessary, the need for more tests. Vaccines  Your health care provider may recommend certain vaccines, such as: Influenza vaccine. This is recommended every year. Tetanus, diphtheria, and acellular pertussis (Tdap, Td) vaccine. You may need a Td booster every 10 years. Zoster  vaccine. You may need this after age  50. Pneumococcal 13-valent conjugate (PCV13) vaccine. One dose is recommended after age 83. Pneumococcal polysaccharide (PPSV23) vaccine. One dose is recommended after age 54. Talk to your health care provider about which screenings and vaccines you need and how often you need them. This information is not intended to replace advice given to you by your health care provider. Make sure you discuss any questions you have with your health care provider. Document Released: 03/27/2015 Document Revised: 11/18/2015 Document Reviewed: 12/30/2014 Elsevier Interactive Patient Education  2017 [place] Prevention in the Home Falls can cause injuries. They can happen to people of all ages. There are many things you can do to make your home safe and to help prevent falls. What can I do on the outside of my home? Regularly fix the edges of walkways and driveways and fix any cracks. Remove anything that might make you trip as you walk through a door, such as a raised step or threshold. Trim any bushes or trees on the path to your home. Use bright outdoor lighting. Clear any walking paths of anything that might make someone trip, such as rocks or tools. Regularly check to see if handrails are loose or broken. Make sure that both sides of any steps have handrails. Any raised decks and porches should have guardrails on the edges. Have any leaves, snow, or ice cleared regularly. Use sand or salt on walking paths during winter. Clean up any spills in your garage right away. This includes oil or grease spills. What can I do in the bathroom? Use night lights. Install grab bars by the toilet and in the tub and shower. Do not use towel bars as grab bars. Use non-skid mats or decals in the tub or shower. If you need to sit down in the shower, use a plastic, non-slip stool. Keep the floor dry. Clean up any water that spills on the floor as soon as it happens. Remove soap buildup in the tub or shower  regularly. Attach bath mats securely with double-sided non-slip rug tape. Do not have throw rugs and other things on the floor that can make you trip. What can I do in the bedroom? Use night lights. Make sure that you have a light by your bed that is easy to reach. Do not use any sheets or blankets that are too big for your bed. They should not hang down onto the floor. Have a firm chair that has side arms. You can use this for support while you get dressed. Do not have throw rugs and other things on the floor that can make you trip. What can I do in the kitchen? Clean up any spills right away. Avoid walking on wet floors. Keep items that you use a lot in easy-to-reach places. If you need to reach something above you, use a strong step stool that has a grab bar. Keep electrical cords out of the way. Do not use floor polish or wax that makes floors slippery. If you must use wax, use non-skid floor wax. Do not have throw rugs and other things on the floor that can make you trip. What can I do with my stairs? Do not leave any items on the stairs. Make sure that there are handrails on both sides of the stairs and use them. Fix handrails that are broken or loose. Make sure that handrails are as long as the stairways. Check any carpeting to make sure that it  is firmly attached to the stairs. Fix any carpet that is loose or worn. Avoid having throw rugs at the top or bottom of the stairs. If you do have throw rugs, attach them to the floor with carpet tape. Make sure that you have a light switch at the top of the stairs and the bottom of the stairs. If you do not have them, ask someone to add them for you. What else can I do to help prevent falls? Wear shoes that: Do not have high heels. Have rubber bottoms. Are comfortable and fit you well. Are closed at the toe. Do not wear sandals. If you use a stepladder: Make sure that it is fully opened. Do not climb a closed stepladder. Make sure that  both sides of the stepladder are locked into place. Ask someone to hold it for you, if possible. Clearly mark and make sure that you can see: Any grab bars or handrails. First and last steps. Where the edge of each step is. Use tools that help you move around (mobility aids) if they are needed. These include: Canes. Walkers. Scooters. Crutches. Turn on the lights when you go into a dark area. Replace any light bulbs as soon as they burn out. Set up your furniture so you have a clear path. Avoid moving your furniture around. If any of your floors are uneven, fix them. If there are any pets around you, be aware of where they are. Review your medicines with your doctor. Some medicines can make you feel dizzy. This can increase your chance of falling. Ask your doctor what other things that you can do to help prevent falls. This information is not intended to replace advice given to you by your health care provider. Make sure you discuss any questions you have with your health care provider. Document Released: 12/25/2008 Document Revised: 08/06/2015 Document Reviewed: 04/04/2014 Elsevier Interactive Patient Education  2017 [employer].

## 2021-11-27 ENCOUNTER — Other Ambulatory Visit: Payer: Self-pay | Admitting: Medical

## 2021-11-29 NOTE — Telephone Encounter (Signed)
Pt has upcoming appointment 

## 2021-12-07 ENCOUNTER — Ambulatory Visit (INDEPENDENT_AMBULATORY_CARE_PROVIDER_SITE_OTHER): Payer: Medicare Other | Admitting: Medical

## 2021-12-07 VITALS — BP 110/70 | HR 73 | Wt 191.8 lb

## 2021-12-07 DIAGNOSIS — E1159 Type 2 diabetes mellitus with other circulatory complications: Secondary | ICD-10-CM

## 2021-12-07 DIAGNOSIS — E1165 Type 2 diabetes mellitus with hyperglycemia: Secondary | ICD-10-CM | POA: Diagnosis not present

## 2021-12-07 DIAGNOSIS — R809 Proteinuria, unspecified: Secondary | ICD-10-CM

## 2021-12-07 DIAGNOSIS — I152 Hypertension secondary to endocrine disorders: Secondary | ICD-10-CM

## 2021-12-07 DIAGNOSIS — E118 Type 2 diabetes mellitus with unspecified complications: Secondary | ICD-10-CM | POA: Diagnosis not present

## 2021-12-07 DIAGNOSIS — E785 Hyperlipidemia, unspecified: Secondary | ICD-10-CM

## 2021-12-07 LAB — POCT GLYCOSYLATED HEMOGLOBIN (HGB A1C): Hemoglobin A1C: 9.3 % — AB (ref 4.0–5.6)

## 2021-12-07 MED ORDER — TRESIBA FLEXTOUCH 100 UNIT/ML ~~LOC~~ SOPN: 40.0000 [IU] | PEN_INJECTOR | Freq: Every day | SUBCUTANEOUS | 3 refills | Status: DC

## 2021-12-07 MED ORDER — METFORMIN HCL ER 750 MG PO TB24
750.0000 mg | ORAL_TABLET | Freq: Two times a day (BID) | ORAL | 1 refills | Status: DC
Start: 2021-12-07 — End: 2022-05-30

## 2021-12-07 MED ORDER — ROSUVASTATIN CALCIUM 20 MG PO TABS: 20.0000 mg | ORAL_TABLET | Freq: Every day | ORAL | 3 refills | Status: DC

## 2021-12-07 NOTE — Progress Notes (Signed)
Subjective:  Christopher Gay is a 66 y.o. male who presents for Chief Complaint  Patient presents with   4 month diabetes check    4 month diabetes check, will schedule an appointment for eye exam     Here for med check.  He is checking sugars.  Sometimes high, sometimes low.   Sometimes 78, but sometimes high numbness.  Eats a lot of salads, sometimes fried foods.  Walks and gets some exercise.  Taking Metformin 500mg  XR BID with food.  Doesn't take it sometimes in the morning if he skips breakfast.  Doing Tresiba 28 units daily, but 2 days per week doing 30 units.  No hypoglycemia.  He thinks some high numbers has been bread and fried foods at times.    HTN - compliant with lisinopril  Hyperlipidemia - compliant with Crestor without problems.   No other aggravating or relieving factors.    No other c/o.  Past Medical History:  Diagnosis Date   Arthritis    BPH without urinary obstruction    Diabetes mellitus without complication ([place]) 7510   diagnosed in [place]   Hyperlipidemia    Hypertension     prior on medication year ago   Kidney stone    Current Outpatient Medications on File Prior to Visit  Medication Sig Dispense Refill   BD PEN NEEDLE NANO 2ND GEN 32G X 4 MM MISC 1 EACH BY DOES NOT APPLY ROUTE AT BEDTIME. 100 each 1   lisinopril (ZESTRIL) 20 MG tablet TAKE 1 TABLET BY MOUTH EVERY DAY 90 tablet 0   No current facility-administered medications on file prior to visit.     The following portions of the patient's history were reviewed and updated as appropriate: allergies, current medications, past family history, past medical history, past social history, past surgical history and problem list.  ROS Otherwise as in subjective above    Objective: BP 110/70   Pulse 73   Wt 191 lb 12.8 oz (87 kg)   BMI 27.52 kg/m   General appearance: alert, no distress, well developed, well nourished Neck: supple, no lymphadenopathy, no thyromegaly, no masses Heart: RRR,  normal S1, S2, no murmurs Lungs: CTA bilaterally, no wheezes, rhonchi, or rales Pulses: 2+ radial pulses, 2+ pedal pulses, normal cap refill Ext: no edema  Diabetic Foot Exam - Simple   Simple Foot Form Diabetic Foot exam was performed with the following findings: Yes 12/07/2021 11:24 AM  Visual Inspection See comments: Yes Sensation Testing Intact to touch and monofilament testing bilaterally: Yes Pulse Check Posterior Tibialis and Dorsalis pulse intact bilaterally: Yes Comments Somewhat thickened and yellowish toenails throughout bilat      Assessment: Encounter Diagnoses  Name Primary?   Type II diabetes mellitus with complication (HCC) Yes   Uncontrolled type 2 diabetes mellitus with hyperglycemia (HCC)    Microalbuminuria    Hypertension associated with type 2 diabetes mellitus ([place])    Hyperlipidemia, unspecified hyperlipidemia type      Plan: I recommended referral to endocrinology but he declines.  We will increase and titrate up Tresiba to 30 units and then increase to units every 5 to 7 days until consistent fasting glucose 150.  We also discussed avoiding hypoglycemia.  Discussed ways to deal with hypoglycemia and preventing this from happening.  Continue metformin but increase to 750 mg 2 tablets daily.  Hypertension-continue lisinopril 20 mg daily  Hyperlipidemia-continue Crestor 20 mg daily   Had flu shot recently at [facility] was seen today for  4 month diabetes check.  Diagnoses and all orders for this visit:  Type II diabetes mellitus with complication (HCC) -     HgB A1c  Uncontrolled type 2 diabetes mellitus with hyperglycemia (HCC)  Microalbuminuria  Hypertension associated with type 2 diabetes mellitus (HCC)  Hyperlipidemia, unspecified hyperlipidemia type  Other orders -     metFORMIN (GLUCOPHAGE-XR) 750 MG 24 hr tablet; Take 1 tablet (750 mg total) by mouth in the morning and at bedtime. -     rosuvastatin (CRESTOR) 20 MG tablet; Take 1  tablet (20 mg total) by mouth daily. -     insulin degludec (TRESIBA FLEXTOUCH) 100 UNIT/ML FlexTouch Pen; Inject 40 Units into the skin at bedtime. Increase to 30 units daily.  Can go up 2 units every week until sugars fasting <130   Follow up: 19mo

## 2021-12-12 ENCOUNTER — Other Ambulatory Visit: Payer: Self-pay | Admitting: Medical

## 2021-12-29 ENCOUNTER — Other Ambulatory Visit (INDEPENDENT_AMBULATORY_CARE_PROVIDER_SITE_OTHER): Payer: Medicare Other

## 2021-12-29 DIAGNOSIS — Z23 Encounter for immunization: Secondary | ICD-10-CM

## 2022-02-07 ENCOUNTER — Encounter: Payer: Medicare Other | Admitting: Medical

## 2022-02-27 ENCOUNTER — Other Ambulatory Visit: Payer: Self-pay | Admitting: Medical

## 2022-03-17 ENCOUNTER — Ambulatory Visit (INDEPENDENT_AMBULATORY_CARE_PROVIDER_SITE_OTHER): Payer: Medicare Other | Admitting: Medical

## 2022-03-17 ENCOUNTER — Encounter: Payer: Self-pay | Admitting: Medical

## 2022-03-17 VITALS — BP 110/70 | HR 76 | Ht 70.0 in | Wt 196.0 lb

## 2022-03-17 DIAGNOSIS — L309 Dermatitis, unspecified: Secondary | ICD-10-CM

## 2022-03-17 DIAGNOSIS — R809 Proteinuria, unspecified: Secondary | ICD-10-CM

## 2022-03-17 DIAGNOSIS — E785 Hyperlipidemia, unspecified: Secondary | ICD-10-CM | POA: Diagnosis not present

## 2022-03-17 DIAGNOSIS — R21 Rash and other nonspecific skin eruption: Secondary | ICD-10-CM

## 2022-03-17 DIAGNOSIS — E1165 Type 2 diabetes mellitus with hyperglycemia: Secondary | ICD-10-CM | POA: Diagnosis not present

## 2022-03-17 DIAGNOSIS — R5383 Other fatigue: Secondary | ICD-10-CM

## 2022-03-17 DIAGNOSIS — N400 Enlarged prostate without lower urinary tract symptoms: Secondary | ICD-10-CM | POA: Diagnosis not present

## 2022-03-17 DIAGNOSIS — Z136 Encounter for screening for cardiovascular disorders: Secondary | ICD-10-CM | POA: Diagnosis not present

## 2022-03-17 DIAGNOSIS — Z125 Encounter for screening for malignant neoplasm of prostate: Secondary | ICD-10-CM

## 2022-03-17 DIAGNOSIS — E1159 Type 2 diabetes mellitus with other circulatory complications: Secondary | ICD-10-CM | POA: Diagnosis not present

## 2022-03-17 DIAGNOSIS — Z87442 Personal history of urinary calculi: Secondary | ICD-10-CM

## 2022-03-17 DIAGNOSIS — E118 Type 2 diabetes mellitus with unspecified complications: Secondary | ICD-10-CM

## 2022-03-17 DIAGNOSIS — I152 Hypertension secondary to endocrine disorders: Secondary | ICD-10-CM | POA: Diagnosis not present

## 2022-03-17 LAB — POCT URINALYSIS DIP (PROADVANTAGE DEVICE)
Bilirubin, UA: NEGATIVE
Blood, UA: NEGATIVE
Glucose, UA: NEGATIVE mg/dL
Ketones, POC UA: NEGATIVE mg/dL
Leukocytes, UA: NEGATIVE
Nitrite, UA: NEGATIVE
Protein Ur, POC: 30 mg/dL — AB
Specific Gravity, Urine: 1.01
pH, UA: 6 (ref 5.0–8.0)

## 2022-03-17 MED ORDER — HYDROCORTISONE 1 % EX CREA: 1.0000 | TOPICAL_CREAM | Freq: Two times a day (BID) | CUTANEOUS | 0 refills | Status: AC

## 2022-03-17 NOTE — Progress Notes (Signed)
Subjective:  Christopher Gay is a 67 y.o. male who presents for Chief Complaint  Patient presents with   Diabetes    Fasting med check for diabetes. No new concerns.      Here for med check.  Been sick for past 2 months.  Had chest congestion.  Had RSV vaccine recently, and was down for 2 weeks, no energy, felt horrible.   Then got exposed to respiratory illness, was sick for 2 months.   Back to exercising on treadmill.    Glucose ranging from 70 to 170 fasting.     Had missed some med doses while sick recently.  Has been avoiding fried foods.  Using 30 units Tresiba daily, Metformin 750mg  BID.  Compliant with statin and Lisinopril  Past due on eye doctor as he has been dealing with dental issues.  Plans to see eye doctor in a few months.  Last cardiology eval 5-6 years.    Wants refill on triamcinolone he uses for rash on face that he calls ringworm, bilat cheeks.  No other aggravating or relieving factors.    No other c/o.  Past Medical History:  Diagnosis Date   Arthritis    BPH without urinary obstruction    Diabetes mellitus without complication ([place]) 7829   diagnosed in [place]   Hyperlipidemia    Hypertension     prior on medication year ago   Kidney stone    Current Outpatient Medications on File Prior to Visit  Medication Sig Dispense Refill   insulin degludec (TRESIBA FLEXTOUCH) 100 UNIT/ML FlexTouch Pen Inject 40 Units into the skin at bedtime. Increase to 30 units daily.  Can go up 2 units every week until sugars fasting <130 15 mL 3   lisinopril (ZESTRIL) 20 MG tablet TAKE 1 TABLET BY MOUTH EVERY DAY 90 tablet 1   metFORMIN (GLUCOPHAGE-XR) 750 MG 24 hr tablet Take 1 tablet (750 mg total) by mouth in the morning and at bedtime. 180 tablet 1   rosuvastatin (CRESTOR) 20 MG tablet Take 1 tablet (20 mg total) by mouth daily. 90 tablet 3   BD PEN NEEDLE NANO 2ND GEN 32G X 4 MM MISC 1 EACH BY DOES NOT APPLY ROUTE AT BEDTIME. 100 each 1   No current  facility-administered medications on file prior to visit.    The following portions of the patient's history were reviewed and updated as appropriate: allergies, current medications, past family history, past medical history, past social history, past surgical history and problem list.  ROS Otherwise as in subjective above  Objective: BP 110/70   Pulse 76   Ht 5\' 10"  (1.778 m)   Wt 196 lb (88.9 kg)   BMI 28.12 kg/m   BP Readings from Last 3 Encounters:  03/17/22 110/70  12/07/21 110/70  11/26/21 136/80   Wt Readings from Last 3 Encounters:  03/17/22 196 lb (88.9 kg)  12/07/21 191 lb 12.8 oz (87 kg)  11/26/21 193 lb 12.8 oz (87.9 kg)   General appearance: alert, no distress, well developed, well nourished HEENT: normocephalic, sclerae anicteric, conjunctiva pink and moist, TMs pearly, nares patent, no discharge or erythema, pharynx normal Oral cavity: MMM, no lesions Neck: supple, no lymphadenopathy, no thyromegaly, no masses Heart: RRR, normal S1, S2, no murmurs Lungs: CTA bilaterally, no wheezes, rhonchi, or rales Pulses: 2+ radial pulses, 2+ pedal pulses, normal cap refill Ext: no edema Skin: mild erythema of nose and pink some what puffy patches on bilat cheeks, nonspecific  Diabetic Foot Exam -  Simple   Simple Foot Form Diabetic Foot exam was performed with the following findings: Yes 03/17/2022 10:26 AM  Visual Inspection See comments: Yes Sensation Testing Intact to touch and monofilament testing bilaterally: Yes Pulse Check Posterior Tibialis and Dorsalis pulse intact bilaterally: Yes Comments Yellowish thickened nails throughout both feet      EKG reviewed   Assessment: Encounter Diagnoses  Name Primary?   Hypertension associated with type 2 diabetes mellitus ([place]) Yes   Type II diabetes mellitus with complication ([place])    Uncontrolled type 2 diabetes mellitus with hyperglycemia (HCC)    BPH without obstruction/lower urinary tract symptoms     Hyperlipidemia, unspecified hyperlipidemia type    Microalbuminuria    Screening for prostate cancer    Screening for heart disease    History of kidney stones    Other fatigue    Rash    Dermatitis      Plan: Hypertension associated with diabetes - continue current medication  Diabetes - updated labs today, continue metformin XR 750mg  BID and Tresiba 30 u daily  Hyperlipidemia - continue statin, labs today  BPH, screen for prostate cancer - labs today  Baseline EKG today  Advised eye doctor f/u  Rash, dermatitis - advised that rash doesn't appear to be ringworm on face, and should not be using triamcinolone on face.  Begin cream below for flares ups, use daily moisturizing lotion as well.  Consider dermatology consult.   [name] was seen today for diabetes.  Diagnoses and all orders for this visit:  Hypertension associated with type 2 diabetes mellitus ([place]) -     Comprehensive metabolic panel -     CBC with Differential/Platelet -     Microalbumin/Creatinine Ratio, Urine -     POCT Urinalysis DIP (Proadvantage Device)  Type II diabetes mellitus with complication (HCC) -     CBC with Differential/Platelet -     Hemoglobin A1c -     Microalbumin/Creatinine Ratio, Urine  Uncontrolled type 2 diabetes mellitus with hyperglycemia (HCC) -     CBC with Differential/Platelet -     Hemoglobin A1c -     Microalbumin/Creatinine Ratio, Urine  BPH without obstruction/lower urinary tract symptoms -     CBC with Differential/Platelet -     POCT Urinalysis DIP (Proadvantage Device) -     PSA  Hyperlipidemia, unspecified hyperlipidemia type -     CBC with Differential/Platelet -     Lipid panel  Microalbuminuria -     CBC with Differential/Platelet  Screening for prostate cancer -     CBC with Differential/Platelet -     PSA  Screening for heart disease -     CBC with Differential/Platelet -     EKG 12-Lead  History of kidney stones -     CBC with  Differential/Platelet  Other fatigue -     CBC with Differential/Platelet  Rash  Dermatitis    Follow up: pending labs

## 2022-03-18 ENCOUNTER — Other Ambulatory Visit: Payer: Self-pay | Admitting: Medical

## 2022-03-18 DIAGNOSIS — E1165 Type 2 diabetes mellitus with hyperglycemia: Secondary | ICD-10-CM

## 2022-03-18 LAB — CBC WITH DIFFERENTIAL/PLATELET
Basophils Absolute: 0 10*3/uL (ref 0.0–0.2)
Basos: 1 %
EOS (ABSOLUTE): 0.2 10*3/uL (ref 0.0–0.4)
Eos: 4 %
Hematocrit: 33.9 % — ABNORMAL LOW (ref 37.5–51.0)
Hemoglobin: 12.1 g/dL — ABNORMAL LOW (ref 13.0–17.7)
Immature Grans (Abs): 0 10*3/uL (ref 0.0–0.1)
Immature Granulocytes: 0 %
Lymphocytes Absolute: 1.6 10*3/uL (ref 0.7–3.1)
Lymphs: 28 %
MCH: 31 pg (ref 26.6–33.0)
MCHC: 35.7 g/dL (ref 31.5–35.7)
MCV: 87 fL (ref 79–97)
Monocytes Absolute: 0.5 10*3/uL (ref 0.1–0.9)
Monocytes: 8 %
Neutrophils Absolute: 3.5 10*3/uL (ref 1.4–7.0)
Neutrophils: 59 %
Platelets: 217 10*3/uL (ref 150–450)
RBC: 3.9 x10E6/uL — ABNORMAL LOW (ref 4.14–5.80)
RDW: 11.7 % (ref 11.6–15.4)
WBC: 5.9 10*3/uL (ref 3.4–10.8)

## 2022-03-18 LAB — MICROALBUMIN / CREATININE URINE RATIO
Creatinine, Urine: 51.6 mg/dL
Microalb/Creat Ratio: 275 mg/g creat — ABNORMAL HIGH (ref 0–29)
Microalbumin, Urine: 141.7 ug/mL

## 2022-03-18 LAB — COMPREHENSIVE METABOLIC PANEL
ALT: 26 IU/L (ref 0–44)
AST: 21 IU/L (ref 0–40)
Albumin/Globulin Ratio: 2.1 (ref 1.2–2.2)
Albumin: 4.4 g/dL (ref 3.9–4.9)
Alkaline Phosphatase: 61 IU/L (ref 44–121)
BUN/Creatinine Ratio: 17 (ref 10–24)
BUN: 13 mg/dL (ref 8–27)
Bilirubin Total: 0.6 mg/dL (ref 0.0–1.2)
CO2: 21 mmol/L (ref 20–29)
Calcium: 9.6 mg/dL (ref 8.6–10.2)
Chloride: 101 mmol/L (ref 96–106)
Creatinine, Ser: 0.77 mg/dL (ref 0.76–1.27)
Globulin, Total: 2.1 g/dL (ref 1.5–4.5)
Glucose: 217 mg/dL — ABNORMAL HIGH (ref 70–99)
Potassium: 4.5 mmol/L (ref 3.5–5.2)
Sodium: 137 mmol/L (ref 134–144)
Total Protein: 6.5 g/dL (ref 6.0–8.5)
eGFR: 99 mL/min/{1.73_m2} (ref >59)

## 2022-03-18 LAB — LIPID PANEL
Chol/HDL Ratio: 3.4 ratio (ref 0.0–5.0)
Cholesterol, Total: 129 mg/dL (ref 100–199)
HDL: 38 mg/dL — ABNORMAL LOW (ref >39)
LDL Chol Calc (NIH): 65 mg/dL (ref 0–99)
Triglycerides: 151 mg/dL — ABNORMAL HIGH (ref 0–149)
VLDL Cholesterol Cal: 26 mg/dL (ref 5–40)

## 2022-03-18 LAB — HEMOGLOBIN A1C
Est. average glucose Bld gHb Est-mCnc: 226 mg/dL
Hgb A1c MFr Bld: 9.5 % — ABNORMAL HIGH (ref 4.8–5.6)

## 2022-03-18 LAB — PSA: Prostate Specific Ag, Serum: 0.6 ng/mL (ref 0.0–4.0)

## 2022-03-18 MED ORDER — TRESIBA FLEXTOUCH 100 UNIT/ML ~~LOC~~ SOPN: 40.0000 [IU] | PEN_INJECTOR | Freq: Every day | SUBCUTANEOUS | 1 refills | Status: AC

## 2022-03-18 MED ORDER — EMPAGLIFLOZIN 10 MG PO TABS: 10.0000 mg | ORAL_TABLET | Freq: Every day | ORAL | 2 refills | Status: AC

## 2022-03-18 NOTE — Progress Notes (Signed)
Microalbumin kidney marker is higher than prior.  The recommendation would be to add on Jardiance or Farxiga which is an old medicine that can help with sugars also helps to reduce progression to worse kidney disease

## 2022-03-18 NOTE — Progress Notes (Signed)
Labs show liver kidney electrolytes normal, bad cholesterol is okay, good cholesterol is too low, diabetes marker still not at goal 9.5%.  Prostate blood test normal.  Still pending microalbumin kidney marker.  Blood sugar was over 200.  I am placing a referral to endocrinology as you are still not getting to goal.  Go ahead and increase your nighttime insulin to 40 units as the current dose is not working  Continue the rest of medicines as usual.  We had tried some other medicine before which I recommend going back and getting all of those as well to help with the sugars.  If agreeable I will add something else to help with blood sugar control similar to prior recommendations

## 2022-03-22 ENCOUNTER — Telehealth: Payer: Self-pay | Admitting: Medical

## 2022-03-22 NOTE — Telephone Encounter (Signed)
Christopher Gay walked in today and handed me a form about the [place] he is on and why he can no longer take it. It looks like it is due to stomach pain, tiredness and dehydration. I have put the form he brought in your folder that goes back to you for reference.

## 2022-04-21 ENCOUNTER — Other Ambulatory Visit: Payer: Self-pay | Admitting: Medical

## 2022-05-30 ENCOUNTER — Other Ambulatory Visit: Payer: Self-pay | Admitting: Medical

## 2022-06-19 IMAGING — CT CT ABD-PELV W/ CM
2 of 5 series · 14 of 46 positions shown, 16 images · IV contrast (OMNIPAQUE 300)
Comparison: CT the abdomen and pelvis 09/21/2020.

CLINICAL DATA: 65-year-old male with history of left lower quadrant
abdominal pain since 03/17/2021.

EXAM:
CT ABDOMEN AND PELVIS WITH CONTRAST
TECHNIQUE: Multidetector CT imaging of the abdomen and pelvis was performed
using the standard protocol following bolus administration of
intravenous contrast.

[Series 2: axial st · axial · 0.80mm/px · z∈[-483,-38]mm · 11 of 101 slices shown, 13 images]
[im 6/101  soft-tissue]
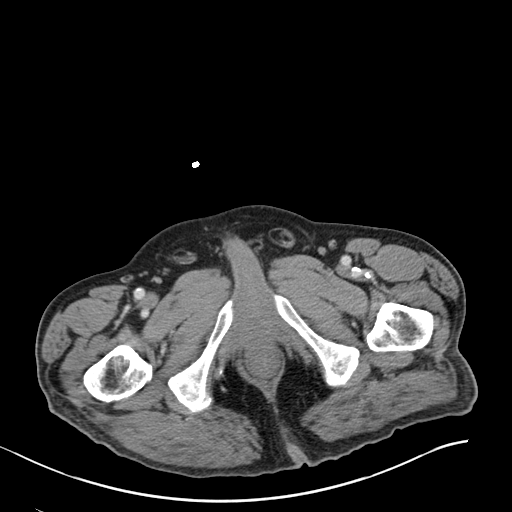
[im 6/101  bone]
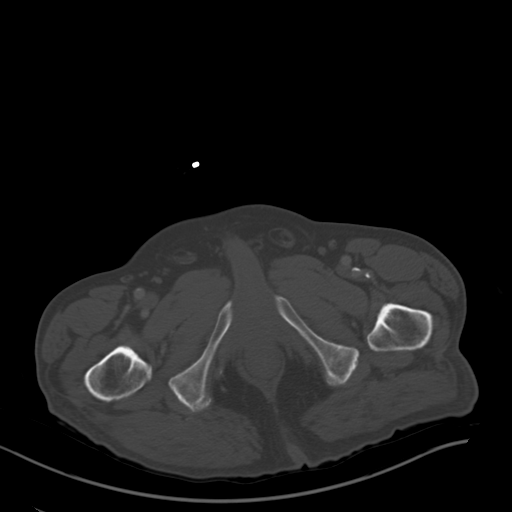
[im 18/101  soft-tissue]
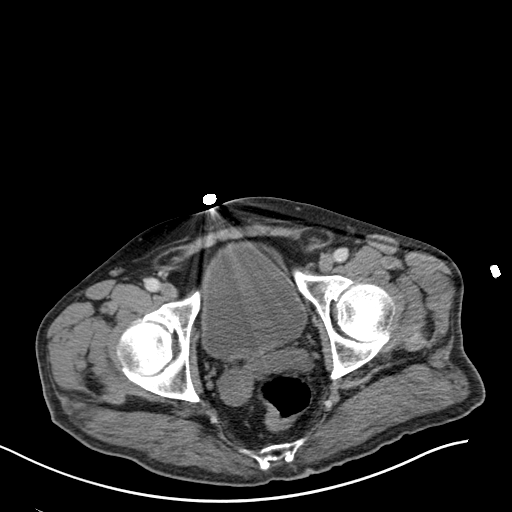
[im 24/101  soft-tissue]
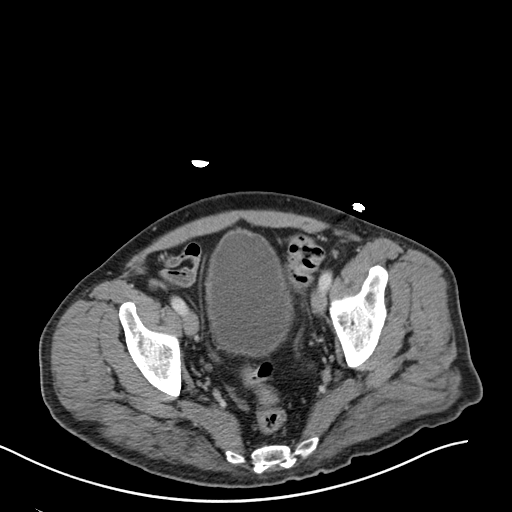
[im 36/101  soft-tissue]
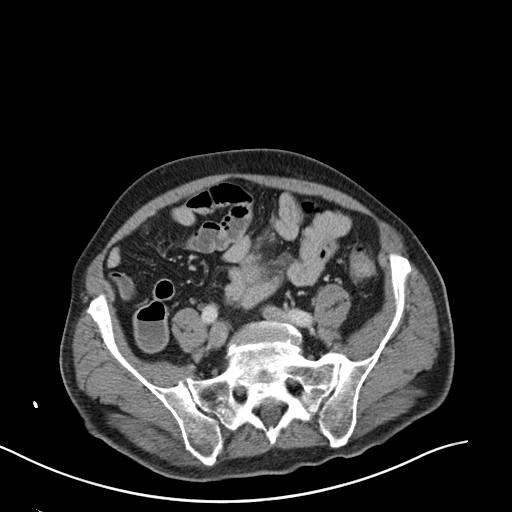
[im 42/101  soft-tissue]
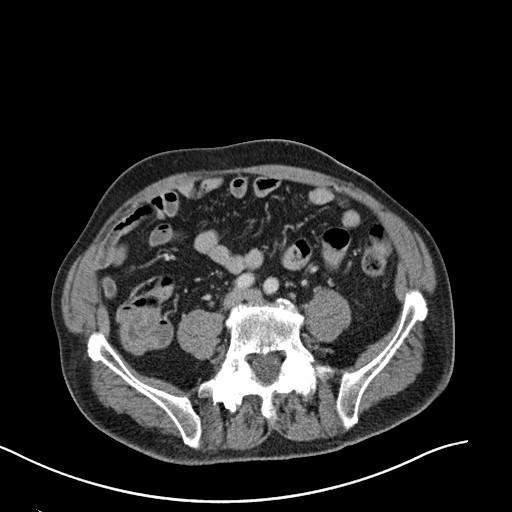
[im 53/101  soft-tissue]
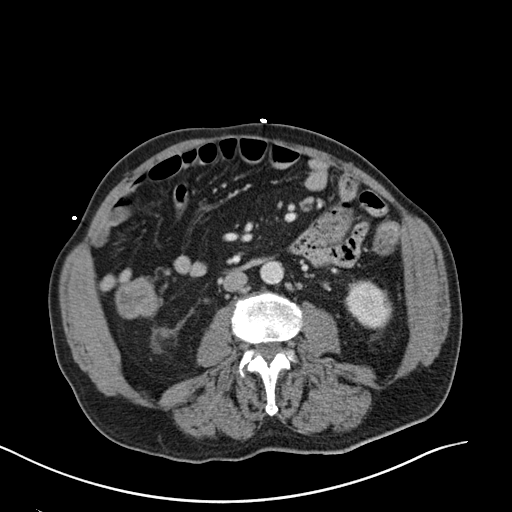
[im 59/101  soft-tissue]
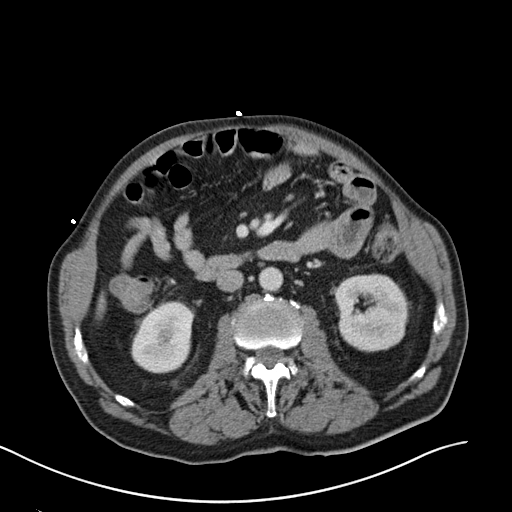
[im 65/101  soft-tissue]
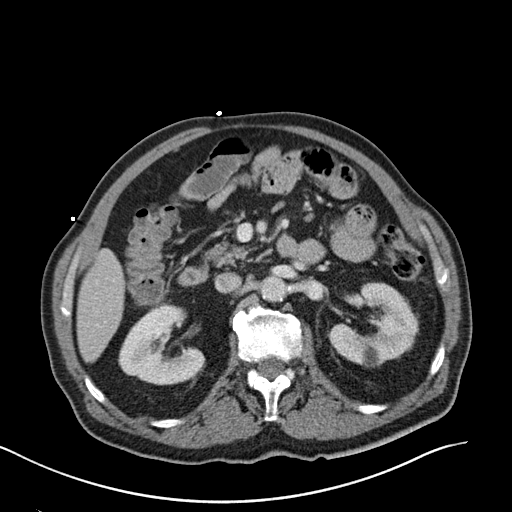
[im 77/101  soft-tissue]
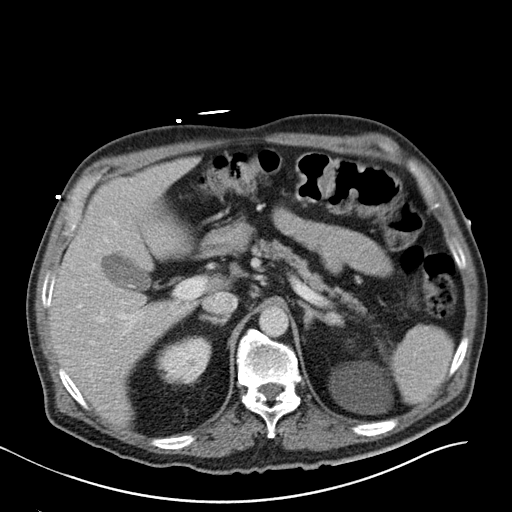
[im 77/101  bone]
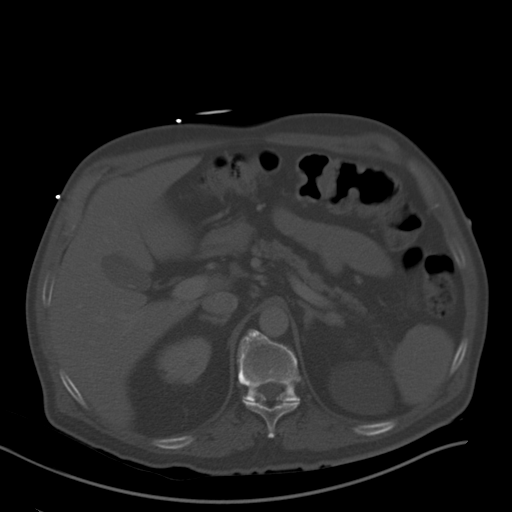
[im 83/101  soft-tissue]
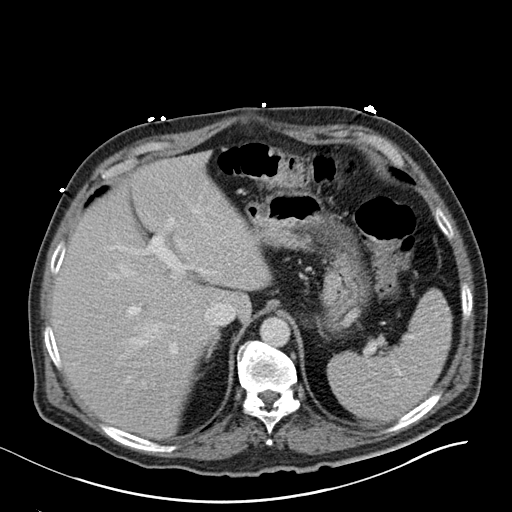
[im 95/101  soft-tissue]
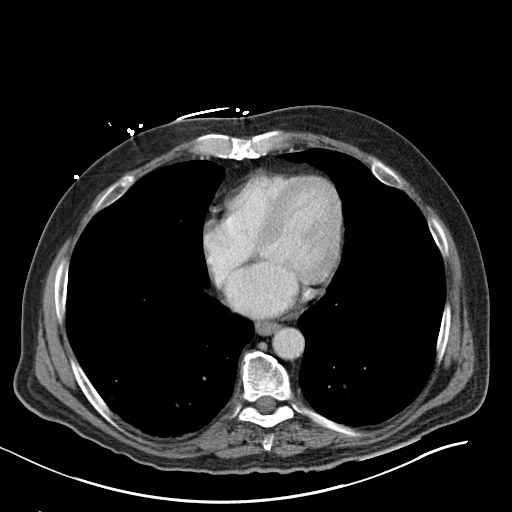

[Series 4: coronal st · coronal · 0.69mm/px · 3 of 147 slices shown]
[im 49/147  soft-tissue]
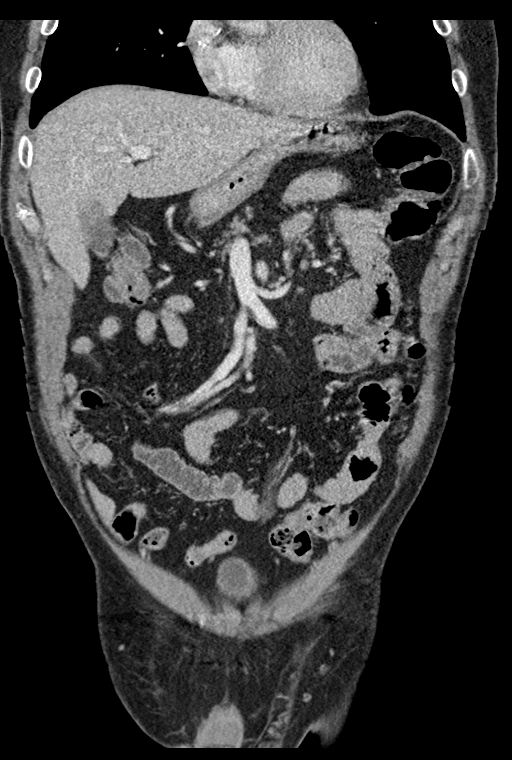
[im 65/147  soft-tissue]
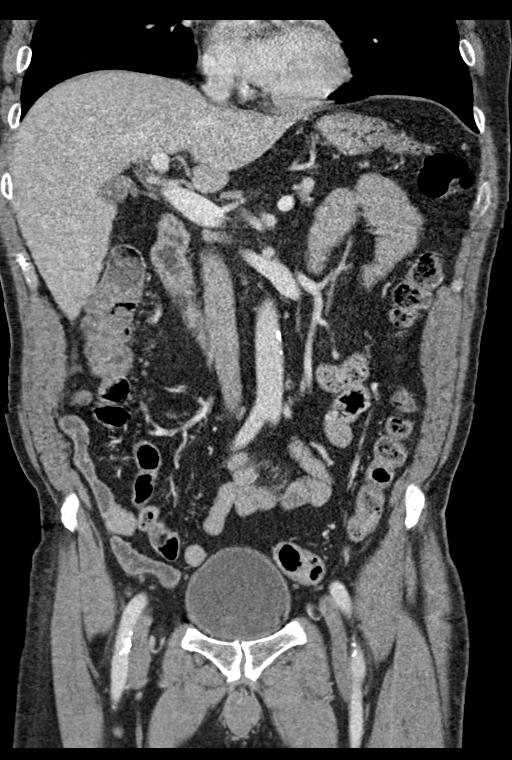
[im 82/147  soft-tissue]
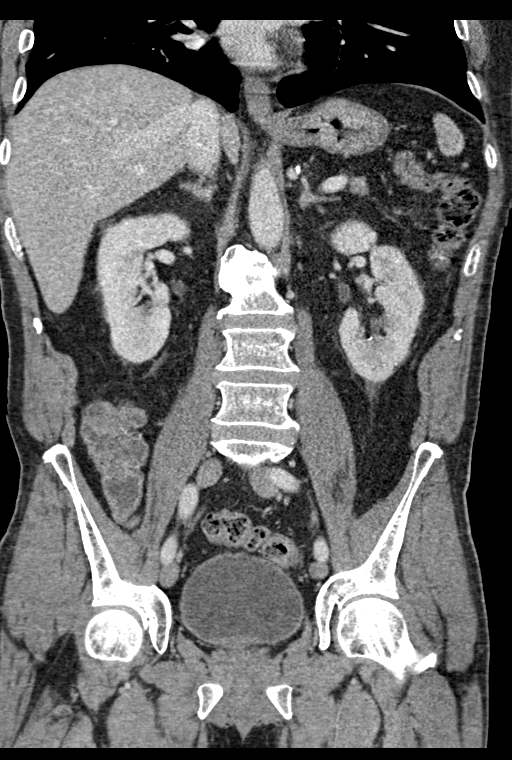

[14 of 46 positions shown; findings below may reference images not displayed]

RADIATION DOSE REDUCTION: This exam was performed according to the
departmental dose-optimization program which includes automated
exposure control, adjustment of the mA and/or kV according to
patient size and/or use of iterative reconstruction technique.

CONTRAST:  100mL OMNIPAQUE IOHEXOL 300 MG/ML  SOLN
FINDINGS: Lower chest: Atherosclerotic calcifications in the descending
thoracic aorta as well as the right coronary artery. Thickening
calcification of the aortic valve.

Hepatobiliary: No suspicious cystic or solid hepatic lesions. No
intra or extrahepatic biliary ductal dilatation. Small partially
calcified gallstones lying dependently in the gallbladder. No
findings to suggest an acute cholecystitis are noted at this time.

Pancreas: No pancreatic mass. No pancreatic ductal dilatation. No
pancreatic or peripancreatic fluid collections or inflammatory
changes.

Spleen: Unremarkable.

Adrenals/Urinary Tract: Multiple low-attenuation lesions in the left
kidney compatible with simple cysts, largest of which is exophytic
extending from the upper pole measuring up to 6.2 cm in diameter,
compatible with simple cysts. Exophytic 1.5 cm intermediate
attenuation (23 HU) lesion in the posterior aspect of the upper pole
of the right kidney (axial image 32 of series 2), similar to prior
examination, favored to represent a minimally proteinaceous cyst.
Other subcentimeter low-attenuation lesions in both kidneys, too
small to definitively characterize, but favored to represent tiny
cysts. 4 mm nonobstructive calculus in the upper pole collecting
system of the left kidney. No hydroureteronephrosis. Urinary bladder
is normal in appearance. Bilateral adrenal glands are normal in
appearance.

Stomach/Bowel: The appearance of the stomach is normal. There is no
pathologic dilatation of small bowel or colon. Normal appendix.

Vascular/Lymphatic: No significant atherosclerotic disease, aneurysm
or dissection noted in the abdominal or pelvic vasculature. No
lymphadenopathy noted in the abdomen or pelvis.

Reproductive: Prostate gland and seminal vesicles are unremarkable
in appearance.

Other: Trace volume of ascites.  No pneumoperitoneum.

Musculoskeletal: There are no aggressive appearing lytic or blastic
lesions noted in the visualized portions of the skeleton.
IMPRESSION: 1. Trace volume of ascites.
2. No other acute findings are noted in the abdomen or pelvis to
account for the patient's symptoms.
3. Cholelithiasis without evidence of acute cholecystitis.
4. 4 mm nonobstructive calculus in the upper pole collecting system
of the left kidney. No ureteral stones or findings of urinary tract
obstruction.
5. Aortic atherosclerosis, in addition to at least right coronary
artery disease. Please note that although the presence of coronary
artery calcium documents the presence of coronary artery disease,
the severity of this disease and any potential stenosis cannot be
assessed on this non-gated CT examination. Assessment for potential
risk factor modification, dietary therapy or pharmacologic therapy
may be warranted, if clinically indicated.
6. There are calcifications of the aortic valve. Echocardiographic
correlation for evaluation of potential valvular dysfunction may be
warranted if clinically indicated.
7. Additional incidental findings, as above.

## 2022-07-06 ENCOUNTER — Ambulatory Visit: Payer: Medicare Other | Admitting: Medical

## 2022-09-01 ENCOUNTER — Other Ambulatory Visit: Payer: Self-pay | Admitting: Medical

## 2022-09-18 ENCOUNTER — Other Ambulatory Visit: Payer: Self-pay | Admitting: Medical

## 2022-09-19 NOTE — Telephone Encounter (Signed)
Pt states he is not longer a patient here and sees another clinic. I will deny this

## 2022-10-03 ENCOUNTER — Other Ambulatory Visit: Payer: Self-pay | Admitting: Medical

## 2022-10-03 NOTE — Telephone Encounter (Signed)
Pt was referred to Endocrinology back in January 2024

## 2022-12-07 ENCOUNTER — Other Ambulatory Visit: Payer: Self-pay | Admitting: Medical

## 2023-04-07 ENCOUNTER — Other Ambulatory Visit (HOSPITAL_BASED_OUTPATIENT_CLINIC_OR_DEPARTMENT_OTHER): Payer: Self-pay

## 2023-04-07 MED ORDER — INSULIN DEGLUDEC FLEXTOUCH 100 UNIT/ML ~~LOC~~ SOPN
40.0000 [IU] | PEN_INJECTOR | Freq: Every day | SUBCUTANEOUS | 5 refills | Status: AC
  Filled 2023-04-07: qty 12, 30d supply, fill #0
